# Patient Record
Sex: Female | Born: 1970
Health system: Southern US, Community
[De-identification: ages and names within clinical notes are randomized; demographics above are authoritative.]

## PROBLEM LIST (undated history)

## (undated) DIAGNOSIS — R87619 Unspecified abnormal cytological findings in specimens from cervix uteri: Secondary | ICD-10-CM

## (undated) DIAGNOSIS — A64 Unspecified sexually transmitted disease: Secondary | ICD-10-CM

## (undated) DIAGNOSIS — Z30430 Encounter for insertion of intrauterine contraceptive device: Secondary | ICD-10-CM

## (undated) DIAGNOSIS — M5090 Cervical disc disorder, unspecified, unspecified cervical region: Secondary | ICD-10-CM

## (undated) DIAGNOSIS — F32A Depression, unspecified: Secondary | ICD-10-CM

## (undated) DIAGNOSIS — F419 Anxiety disorder, unspecified: Secondary | ICD-10-CM

## (undated) DIAGNOSIS — F329 Major depressive disorder, single episode, unspecified: Secondary | ICD-10-CM

## (undated) DIAGNOSIS — T782XXA Anaphylactic shock, unspecified, initial encounter: Secondary | ICD-10-CM

## (undated) DIAGNOSIS — G43909 Migraine, unspecified, not intractable, without status migrainosus: Secondary | ICD-10-CM

## (undated) DIAGNOSIS — S61216A Laceration without foreign body of right little finger without damage to nail, initial encounter: Secondary | ICD-10-CM

## (undated) HISTORY — DX: Cervical disc disorder, unspecified, unspecified cervical region: M50.90

## (undated) HISTORY — DX: Anxiety disorder, unspecified: F41.9

## (undated) HISTORY — DX: Depression, unspecified: F32.A

## (undated) HISTORY — DX: Migraine, unspecified, not intractable, without status migrainosus: G43.909

## (undated) HISTORY — DX: Laceration without foreign body of right little finger without damage to nail, initial encounter: S61.216A

## (undated) HISTORY — DX: Unspecified abnormal cytological findings in specimens from cervix uteri: R87.619

## (undated) HISTORY — DX: Major depressive disorder, single episode, unspecified: F32.9

## (undated) HISTORY — DX: Encounter for insertion of intrauterine contraceptive device: Z30.430

## (undated) HISTORY — DX: Unspecified sexually transmitted disease: A64

## (undated) HISTORY — DX: Anaphylactic shock, unspecified, initial encounter: T78.2XXA

---

## 2008-02-09 ENCOUNTER — Other Ambulatory Visit: Admission: RE | Admit: 2008-02-09 | Discharge: 2008-02-09 | Payer: Self-pay | Admitting: Obstetrics & Gynecology

## 2008-04-12 DIAGNOSIS — R87619 Unspecified abnormal cytological findings in specimens from cervix uteri: Secondary | ICD-10-CM

## 2008-04-12 HISTORY — DX: Unspecified abnormal cytological findings in specimens from cervix uteri: R87.619

## 2009-02-01 ENCOUNTER — Encounter: Admission: RE | Admit: 2009-02-01 | Discharge: 2009-02-01 | Payer: Self-pay | Admitting: Family Medicine

## 2009-02-10 HISTORY — PX: COLPOSCOPY: SHX161

## 2009-03-04 ENCOUNTER — Encounter: Admission: RE | Admit: 2009-03-04 | Discharge: 2009-03-04 | Payer: Self-pay | Admitting: Obstetrics & Gynecology

## 2010-06-23 ENCOUNTER — Other Ambulatory Visit: Payer: Self-pay | Admitting: Obstetrics & Gynecology

## 2010-06-23 DIAGNOSIS — Z1231 Encounter for screening mammogram for malignant neoplasm of breast: Secondary | ICD-10-CM

## 2010-07-16 ENCOUNTER — Ambulatory Visit
Admission: RE | Admit: 2010-07-16 | Discharge: 2010-07-16 | Disposition: A | Discharge status: Home / Self Care | Payer: BC Managed Care – PPO | Source: Ambulatory Visit | Attending: Obstetrics & Gynecology | Admitting: Obstetrics & Gynecology

## 2010-07-16 DIAGNOSIS — Z1231 Encounter for screening mammogram for malignant neoplasm of breast: Secondary | ICD-10-CM

## 2011-03-13 DIAGNOSIS — Z30430 Encounter for insertion of intrauterine contraceptive device: Secondary | ICD-10-CM

## 2011-03-13 HISTORY — DX: Encounter for insertion of intrauterine contraceptive device: Z30.430

## 2011-05-13 ENCOUNTER — Encounter (INDEPENDENT_AMBULATORY_CARE_PROVIDER_SITE_OTHER): Payer: BC Managed Care – PPO | Admitting: *Deleted

## 2011-05-13 DIAGNOSIS — M79609 Pain in unspecified limb: Secondary | ICD-10-CM

## 2011-05-13 DIAGNOSIS — R609 Edema, unspecified: Secondary | ICD-10-CM

## 2011-05-17 NOTE — Procedures (Unsigned)
DUPLEX DEEP VENOUS EXAM - LOWER EXTREMITY  INDICATION:  Right lower extremity pain.  HISTORY:  Edema:  No. Trauma/Surgery:  No. Pain:  Yes. PE:  No. Previous DVT:  No. Anticoagulants:  No. Other:  DUPLEX EXAM:               CFV   SFV   PopV  PTV    GSV               R  L  R  L  R  L  R   L  R  L Thrombosis    o     o     o     o      o Spontaneous   +     +     +     +      + Phasic        +     +     +     +      + Augmentation  +     +     +     +      + Compressible  +     +     +     +      + Competent     o     +     +     +      +  Legend:  + - yes  o - no  p - partial  D - decreased  IMPRESSION:  All deep veins of the right lower extremity are patent and compressible.  No evidence of acute deep venous thrombosis visualized.   _____________________________ V. Charlena Cross, MD  SS/MEDQ  D:  05/13/2011  T:  05/13/2011  Job:  [id number]

## 2011-07-20 ENCOUNTER — Other Ambulatory Visit: Payer: Self-pay | Admitting: Obstetrics & Gynecology

## 2011-07-20 DIAGNOSIS — Z1231 Encounter for screening mammogram for malignant neoplasm of breast: Secondary | ICD-10-CM

## 2011-07-28 ENCOUNTER — Ambulatory Visit
Admission: RE | Admit: 2011-07-28 | Discharge: 2011-07-28 | Disposition: A | Discharge status: Home / Self Care | Payer: BC Managed Care – PPO | Source: Ambulatory Visit | Attending: Obstetrics & Gynecology | Admitting: Obstetrics & Gynecology

## 2011-07-28 DIAGNOSIS — Z1231 Encounter for screening mammogram for malignant neoplasm of breast: Secondary | ICD-10-CM

## 2011-07-30 ENCOUNTER — Ambulatory Visit (INDEPENDENT_AMBULATORY_CARE_PROVIDER_SITE_OTHER): Payer: BC Managed Care – PPO | Admitting: General Surgery

## 2011-07-30 ENCOUNTER — Encounter (INDEPENDENT_AMBULATORY_CARE_PROVIDER_SITE_OTHER): Payer: Self-pay | Admitting: General Surgery

## 2011-07-30 VITALS — BP 102/68 | HR 62 | Temp 98.3°F | Ht 67.5 in | Wt 122.0 lb

## 2011-07-30 DIAGNOSIS — K645 Perianal venous thrombosis: Secondary | ICD-10-CM | POA: Insufficient documentation

## 2011-07-30 NOTE — Progress Notes (Signed)
Patient ID: Sandra Juarez, female   DOB: 05-11-1970, 41 y.o.   MRN: [id number]  Chief Complaint  Patient presents with  . Follow-up    eval throm hems    HPI Sandra Juarez is a 41 y.o. female.   HPI  She is referred by Dr. Jacky Kindle for evaluation of an external thrombosed hemorrhoid.  She began having pain and swelling 3 days ago that progressively worsened.  She began having some bleeding yesterday. She went to Dr. Lanell Matar office and then was referred here.  He gave her a prescription for a hemorrhoidal cream.  History reviewed. No pertinent past medical history.  Past Surgical History  Procedure Date  . Cesarean section     History reviewed. No pertinent family history.  Social History History  Substance Use Topics  . Smoking status: Never Smoker   . Smokeless tobacco: Not on file  . Alcohol Use: Yes     social    Allergies  Allergen Reactions  . Lidocaine     Current Outpatient Prescriptions  Medication Sig Dispense Refill  . citalopram (CELEXA) 10 MG tablet Take 10 mg by mouth daily.        Review of Systems Review of Systems  Constitutional: Negative for fever.  Gastrointestinal: Positive for anal bleeding and rectal pain.    Blood pressure 102/68, pulse 62, temperature 98.3 F (36.8 C), temperature source Temporal, height 5' 7.5" (1.715 m), weight 122 lb (55.339 kg), SpO2 98.00%.  Physical Exam Physical Exam  Constitutional: She appears well-developed and well-nourished. No distress.  Genitourinary:       Tender left lateral swelling with purple discoloration present. I massaged this area and she began having spontaneous drainage and evacuation of the clot. I used a hemostat to remove the rest of the clot.    Data Reviewed none  Assessment    Thrombosed external hemorrhoid-this has been drained in the office.    Plan    Frequent warm water soaks this weekend. Use of steroid cream is prescribed.  Go to the emergency department for heavy  bleeding. Return visit in 4 weeks.       Ahja Martello J 07/30/2011, 5:22 PM

## 2011-07-30 NOTE — Patient Instructions (Signed)
Go home and soak in warm water as much as possible this weekend.  No heavy lifting or straining for one week.  If you have heavy bleeding, go to the emergency room.  Use hemorrhoid cream on the area 4 times a day.  It could take two weeks or more for the swelling to go down.

## 2011-08-31 ENCOUNTER — Encounter (INDEPENDENT_AMBULATORY_CARE_PROVIDER_SITE_OTHER): Payer: BC Managed Care – PPO | Admitting: General Surgery

## 2012-07-25 ENCOUNTER — Other Ambulatory Visit: Payer: Self-pay

## 2012-07-25 DIAGNOSIS — Z1231 Encounter for screening mammogram for malignant neoplasm of breast: Secondary | ICD-10-CM

## 2012-08-30 ENCOUNTER — Ambulatory Visit
Admission: RE | Admit: 2012-08-30 | Discharge: 2012-08-30 | Disposition: A | Discharge status: Home / Self Care | Payer: BC Managed Care – PPO | Source: Ambulatory Visit

## 2012-08-30 DIAGNOSIS — Z1231 Encounter for screening mammogram for malignant neoplasm of breast: Secondary | ICD-10-CM

## 2013-05-15 ENCOUNTER — Encounter: Payer: Self-pay | Admitting: Obstetrics and Gynecology

## 2013-05-21 ENCOUNTER — Ambulatory Visit: Payer: Self-pay | Admitting: Obstetrics and Gynecology

## 2013-06-20 ENCOUNTER — Encounter: Payer: Self-pay | Admitting: Obstetrics and Gynecology

## 2013-06-20 ENCOUNTER — Ambulatory Visit (INDEPENDENT_AMBULATORY_CARE_PROVIDER_SITE_OTHER): Payer: 59 | Admitting: Obstetrics and Gynecology

## 2013-06-20 VITALS — BP 100/60 | HR 80 | Resp 18 | Ht 67.0 in | Wt 123.0 lb

## 2013-06-20 DIAGNOSIS — Z0000 Encounter for general adult medical examination without abnormal findings: Secondary | ICD-10-CM

## 2013-06-20 DIAGNOSIS — Z01419 Encounter for gynecological examination (general) (routine) without abnormal findings: Secondary | ICD-10-CM

## 2013-06-20 DIAGNOSIS — Z23 Encounter for immunization: Secondary | ICD-10-CM

## 2013-06-20 LAB — POCT URINALYSIS DIPSTICK
BILIRUBIN UA: NEGATIVE
Blood, UA: NEGATIVE
Glucose, UA: NEGATIVE
Ketones, UA: NEGATIVE
Leukocytes, UA: NEGATIVE
NITRITE UA: NEGATIVE
Protein, UA: NEGATIVE
UROBILINOGEN UA: NEGATIVE
pH, UA: 5

## 2013-06-20 NOTE — Patient Instructions (Signed)
EXERCISE AND DIET:  We recommended that you start or continue a regular exercise program for good health. Regular exercise means any activity that makes your heart beat faster and makes you sweat.  We recommend exercising at least 30 minutes per day at least 3 days a week, preferably 4 or 5.  We also recommend a diet low in fat and sugar.  Inactivity, poor dietary choices and obesity can cause diabetes, heart attack, stroke, and kidney damage, among others.    ALCOHOL AND SMOKING:  Women should limit their alcohol intake to no more than 7 drinks/beers/glasses of wine (combined, not each!) per week. Moderation of alcohol intake to this level decreases your risk of breast cancer and liver damage. And of course, no recreational drugs are part of a healthy lifestyle.  And absolutely no smoking or even second hand smoke. Most people know smoking can cause heart and lung diseases, but did you know it also contributes to weakening of your bones? Aging of your skin?  Yellowing of your teeth and nails?  CALCIUM AND VITAMIN D:  Adequate intake of calcium and Vitamin D are recommended.  The recommendations for exact amounts of these supplements seem to change often, but generally speaking 600 mg of calcium (either carbonate or citrate) and 800 units of Vitamin D per day seems prudent. Certain women may benefit from higher intake of Vitamin D.  If you are among these women, your doctor will have told you during your visit.    PAP SMEARS:  Pap smears, to check for cervical cancer or precancers,  have traditionally been done yearly, although recent scientific advances have shown that most women can have pap smears less often.  However, every woman still should have a physical exam from her gynecologist every year. It will include a breast check, inspection of the vulva and vagina to check for abnormal growths or skin changes, a visual exam of the cervix, and then an exam to evaluate the size and shape of the uterus and  ovaries.  And after 43 years of age, a rectal exam is indicated to check for rectal cancers. We will also provide age appropriate advice regarding health maintenance, like when you should have certain vaccines, screening for sexually transmitted diseases, bone density testing, colonoscopy, mammograms, etc.   MAMMOGRAMS:  All women over 40 years old should have a yearly mammogram. Many facilities now offer a "3D" mammogram, which may cost around $50 extra out of pocket. If possible,  we recommend you accept the option to have the 3D mammogram performed.  It both reduces the number of women who will be called back for extra views which then turn out to be normal, and it is better than the routine mammogram at detecting truly abnormal areas.    COLONOSCOPY:  Colonoscopy to screen for colon cancer is recommended for all women at age 50.  We know, you hate the idea of the prep.  We agree, BUT, having colon cancer and not knowing it is worse!!  Colon cancer so often starts as a polyp that can be seen and removed at colonscopy, which can quite literally save your life!  And if your first colonoscopy is normal and you have no family history of colon cancer, most women don't have to have it again for 10 years.  Once every ten years, you can do something that may end up saving your life, right?  We will be happy to help you get it scheduled when you are ready.    Be sure to check your insurance coverage so you understand how much it will cost.  It may be covered as a preventative service at no cost, but you should check your particular policy.     Tetanus, Diphtheria, Pertussis (Tdap) Vaccine What You Need to Know WHY GET VACCINATED? Tetanus, diphtheria and pertussis can be very serious diseases, even for adolescents and adults. Tdap vaccine can protect [place] from these diseases. TETANUS (Lockjaw) causes painful muscle tightening and stiffness, usually all over the body.  It can lead to tightening of muscles in the  head and neck so you can't open your mouth, swallow, or sometimes even breathe. Tetanus kills about 1 out of 5 people who are infected. DIPHTHERIA can cause a thick coating to form in the back of the throat.  It can lead to breathing problems, paralysis, heart failure, and death. PERTUSSIS (Whooping Cough) causes severe coughing spells, which can cause difficulty breathing, vomiting and disturbed sleep.  It can also lead to weight loss, incontinence, and rib fractures. Up to 2 in 100 adolescents and 5 in 100 adults with pertussis are hospitalized or have complications, which could include pneumonia and death. These diseases are caused by bacteria. Diphtheria and pertussis are spread from person to person through coughing or sneezing. Tetanus enters the body through cuts, scratches, or wounds. Before vaccines, the [place] saw as many as 200,000 cases a year of diphtheria and pertussis, and hundreds of cases of tetanus. Since vaccination began, tetanus and diphtheria have dropped by about 99% and pertussis by about 80%. TDAP VACCINE Tdap vaccine can protect adolescents and adults from tetanus, diphtheria, and pertussis. One dose of Tdap is routinely given at age 46 or 70. People who did not get Tdap at that age should get it as soon as possible. Tdap is especially important for health care professionals and anyone having close contact with a baby younger than 12 months. Pregnant women should get a dose of Tdap during every pregnancy, to protect the newborn from pertussis. Infants are most at risk for severe, life-threatening complications from pertussis. A similar vaccine, called Td, protects from tetanus and diphtheria, but not pertussis. A Td booster should be given every 10 years. Tdap may be given as one of these boosters if you have not already gotten a dose. Tdap may also be given after a severe cut or burn to prevent tetanus infection. Your doctor can give you more information. Tdap may  safely be given at the same time as other vaccines. SOME PEOPLE SHOULD NOT GET THIS VACCINE  If you ever had a life-threatening allergic reaction after a dose of any tetanus, diphtheria, or pertussis containing vaccine, OR if you have a severe allergy to any part of this vaccine, you should not get Tdap. Tell your doctor if you have any severe allergies.  If you had a coma, or long or multiple seizures within 7 days after a childhood dose of DTP or DTaP, you should not get Tdap, unless a cause other than the vaccine was found. You can still get Td.  Talk to your doctor if you:  have epilepsy or another nervous system problem,  had severe pain or swelling after any vaccine containing diphtheria, tetanus or pertussis,  ever had Guillain-Barr Syndrome (GBS),  aren't feeling well on the day the shot is scheduled. RISKS OF A VACCINE REACTION With any medicine, including vaccines, there is a chance of side effects. These are usually mild and go away on their own, but  serious reactions are also possible. Brief fainting spells can follow a vaccination, leading to injuries from falling. Sitting or lying down for about 15 minutes can help prevent these. Tell your doctor if you feel dizzy or light-headed, or have vision changes or ringing in the ears. Mild problems following Tdap (Did not interfere with activities)  Pain where the shot was given (about 3 in 4 adolescents or 2 in 3 adults)  Redness or swelling where the shot was given (about 1 person in 5)  Mild fever of at least 100.4F (up to about 1 in 25 adolescents or 1 in 100 adults)  Headache (about 3 or 4 people in 10)  Tiredness (about 1 person in 3 or 4)  Nausea, vomiting, diarrhea, stomach ache (up to 1 in 4 adolescents or 1 in 10 adults)  Chills, body aches, sore joints, rash, swollen glands (uncommon) Moderate problems following Tdap (Interfered with activities, but did not require medical attention)  Pain where the shot was  given (about 1 in 5 adolescents or 1 in 100 adults)  Redness or swelling where the shot was given (up to about 1 in 16 adolescents or 1 in 25 adults)  Fever over 102F (about 1 in 100 adolescents or 1 in 250 adults)  Headache (about 3 in 20 adolescents or 1 in 10 adults)  Nausea, vomiting, diarrhea, stomach ache (up to 1 or 3 people in 100)  Swelling of the entire arm where the shot was given (up to about 3 in 100). Severe problems following Tdap (Unable to perform usual activities, required medical attention)  Swelling, severe pain, bleeding and redness in the arm where the shot was given (rare). A severe allergic reaction could occur after any vaccine (estimated less than 1 in a million doses). WHAT IF THERE IS A SERIOUS REACTION? What should I look for?  Look for anything that concerns you, such as signs of a severe allergic reaction, very high fever, or behavior changes. Signs of a severe allergic reaction can include hives, swelling of the face and throat, difficulty breathing, a fast heartbeat, dizziness, and weakness. These would start a few minutes to a few hours after the vaccination. What should I do?  If you think it is a severe allergic reaction or other emergency that can't wait, call 9-1-1 or get the person to the nearest hospital. Otherwise, call your doctor.  Afterward, the reaction should be reported to the "Vaccine Adverse Event Reporting System" (VAERS). Your doctor might file this report, or you can do it yourself through the VAERS web site at www.vaers.hhs.gov, or by calling 1-800-822-7967. VAERS is only for reporting reactions. They do not give medical advice.  THE NATIONAL VACCINE INJURY COMPENSATION PROGRAM The National Vaccine Injury Compensation Program (VICP) is a federal program that was created to compensate people who may have been injured by certain vaccines. Persons who believe they may have been injured by a vaccine can learn about the program and about  filing a claim by calling 1-800-338-2382 or visiting the VICP website at www.hrsa.gov/vaccinecompensation. HOW CAN I LEARN MORE?  Ask your doctor.  Call your local or state health department.  Contact the Centers for Disease Control and Prevention (CDC):  Call 1-800-232-4636 or visit CDC's website at www.cdc.gov/vaccines. CDC Tdap Vaccine VIS (08/19/11) Document Released: 09/28/2011 Document Revised: 07/24/2012 Document Reviewed: 07/19/2012 [employer] Patient Information 2014 [employer].  

## 2013-06-20 NOTE — Progress Notes (Signed)
Patient ID: Sandra Juarez, female   DOB: [birth year], 43 y.o.   MRN: [id number] GYNECOLOGY VISIT  PCP:   Burnard Bunting, MD  Referring provider:   HPI: 43 y.o.   Married  Caucasian  female   G2P2 with No LMP recorded. Patient is not currently having periods (Reason: IUD).   here for  AEX.  On double dose of Topamax and migraines are better controlled.   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   Has had hemorrhoids drained in past.  Hgb:    PCP Urine:  negative  GYNECOLOGIC HISTORY: No LMP recorded. Patient is not currently having periods (Reason: IUD). Sexually active:  yes Partner preference: female Contraception:   Mirena IUD--inserted 03/2011 Menopausal hormone therapy: n/a DES exposure:  no  Blood transfusions:   no Sexually transmitted diseases:   HPV GYN procedures and prior surgeries:  C-section x2, colposcopy with biopsies. Last mammogram:  08-30-12 wnl:[facility]               Last pap and high risk HPV testing:   05-18-12 wnl:neg HR HPV History of abnormal pap smear: 02/2009 colposcopy revealed CIN I    OB History   Grav Para Term Preterm Abortions TAB SAB Ect Mult Living   2 2        2        LIFESTYLE: Exercise:   no            Tobacco:   no Alcohol:     10 glasses of wine per week Drug use:  no  OTHER HEALTH MAINTENANCE: Tetanus/TDap:   unsure Gardisil:               n/a Influenza:            01/2013 Zostavax:             n/a  Bone density:       n/a Colonoscopy:       n/a  Cholesterol check:   wnl with PCP  Family History  Problem Relation Age of Onset  . Hypertension Brother   . Diabetes Brother     AODM  . Cancer Paternal Grandfather 10    dec    Patient Active Problem List   Diagnosis Date Noted  . Thrombosed external hemorrhoid-left lateral 07/30/2011   Past Medical History  Diagnosis Date  . Migraines   . Abnormal Pap smear of cervix 2010  . Encounter for insertion of mirena IUD 03/2011  . Depression   . Anxiety   .  Anaphylactic reaction     x 2   (hospitalized)  . STD (sexually transmitted disease)     HPV    Past Surgical History  Procedure Laterality Date  . Colposcopy  02/2009    LGSIL on Bx  . Cesarean section      x 2    ALLERGIES: Lidocaine  Current Outpatient Prescriptions  Medication Sig Dispense Refill  . citalopram (CELEXA) 10 MG tablet Take 10 mg by mouth daily.      Marland Kitchen levonorgestrel (MIRENA) 20 MCG/24HR IUD 1 each by Intrauterine route once.      . rizatriptan (MAXALT-MLT) 10 MG disintegrating tablet Take 10 mg by mouth as needed.      . topiramate (TOPAMAX) 25 MG tablet Take 50 mg by mouth daily.      Marland Kitchen ZOLPIDEM TARTRATE PO Take 12.5 mg by mouth.       No current facility-administered medications for this visit.  ROS:  Pertinent items are noted in HPI.  SOCIAL HISTORY:  Married.  Two sons.   PHYSICAL EXAMINATION:    BP 100/60  Pulse 80  Resp 18  Ht 5\' 7"  (1.702 m)  Wt 123 lb (55.792 kg)  BMI 19.26 kg/m2   Wt Readings from Last 3 Encounters:  06/20/13 123 lb (55.792 kg)  07/30/11 122 lb (55.339 kg)     Ht Readings from Last 3 Encounters:  06/20/13 5\' 7"  (1.702 m)  07/30/11 5' 7.5" (1.715 m)    General appearance: alert, cooperative and appears stated age Head: Normocephalic, without obvious abnormality, atraumatic Neck: no adenopathy, supple, symmetrical, trachea midline and thyroid not enlarged, symmetric, no tenderness/mass/nodules Lungs: clear to auscultation bilaterally Breasts: Inspection negative, No nipple retraction or dimpling, No nipple discharge or bleeding, No axillary or supraclavicular adenopathy, Normal to palpation without dominant masses Heart: regular rate and rhythm Abdomen: Pfannenstiel incision, soft, non-tender; no masses,  no organomegaly Extremities: extremities normal, atraumatic, no cyanosis or edema Skin: Skin color, texture, turgor normal. No rashes or lesions Lymph nodes: Cervical, supraclavicular, and axillary nodes  normal. No abnormal inguinal nodes palpated Neurologic: Grossly normal  Pelvic: External genitalia:  no lesions              Urethra:  normal appearing urethra with no masses, tenderness or lesions              Bartholins and Skenes: normal                 Vagina: normal appearing vagina with normal color and discharge, no lesions              Cervix: normal appearance.  IUD strings noted.               Pap and high risk HPV testing done: no.            Bimanual Exam:  Uterus:  uterus is normal size, shape, consistency and nontender                                      Adnexa: normal adnexa in size, nontender and no masses                                      Rectovaginal: Confirms                                      Anus:  normal sphincter tone, no lesions, anterior hemorrhoid palpable at 12 o'clock.   ASSESSMENT  Normal gynecologic exam. History of LGSIL. Mirena IUD patient.  Migraines.  PLAN  Mammogram recommended yearly.  Pap smear and high risk HPV testing not indicated. Counseled on self breast exam, Calcium and vitamin D intake, exercise. TDap. Return annually or prn   An After Visit Summary was printed and given to the patient.

## 2013-06-22 ENCOUNTER — Ambulatory Visit: Payer: Self-pay | Admitting: Obstetrics and Gynecology

## 2013-07-24 ENCOUNTER — Other Ambulatory Visit: Payer: Self-pay

## 2013-07-24 DIAGNOSIS — Z1231 Encounter for screening mammogram for malignant neoplasm of breast: Secondary | ICD-10-CM

## 2013-09-06 ENCOUNTER — Ambulatory Visit
Admission: RE | Admit: 2013-09-06 | Discharge: 2013-09-06 | Disposition: A | Discharge status: Home / Self Care | Payer: 59 | Source: Ambulatory Visit

## 2013-09-06 ENCOUNTER — Encounter (INDEPENDENT_AMBULATORY_CARE_PROVIDER_SITE_OTHER): Payer: Self-pay

## 2013-09-06 DIAGNOSIS — Z1231 Encounter for screening mammogram for malignant neoplasm of breast: Secondary | ICD-10-CM

## 2014-02-11 ENCOUNTER — Encounter: Payer: Self-pay | Admitting: Obstetrics and Gynecology

## 2014-06-05 ENCOUNTER — Ambulatory Visit (INDEPENDENT_AMBULATORY_CARE_PROVIDER_SITE_OTHER): Payer: 59 | Admitting: Neurology

## 2014-06-05 ENCOUNTER — Ambulatory Visit (INDEPENDENT_AMBULATORY_CARE_PROVIDER_SITE_OTHER): Payer: Self-pay | Admitting: Neurology

## 2014-06-05 DIAGNOSIS — R202 Paresthesia of skin: Secondary | ICD-10-CM

## 2014-06-05 NOTE — Procedures (Signed)
  [facility]    Provider:  Dr Jaynee Eagles Referring Provider: Geoffery Lyons, MD Primary Care Physician:  Geoffery Lyons, MD  History:  Sandra Juarez is a 44 y.o. female here as a referral from Dr. Reynaldo Minium for left upper extremity sensory changes. Symptoms started a few months ago. She endorses numbness and tingling in the left hand in all the fingers with radiation to the dorsal aspect of the forearm. Episodic, lasts a few minutes. Denies neck pain. Had one episode of right hand digit 1-3 numbness. Also has occasional numbness and tingling in all the limbs.   Summary  Nerve conduction studies were performed on the left upper extremity  The left APB Median motor nerve showed normal conductions with normal F Wave latency The left ADM Ulnar motor nerve showed normal conductions with normal F Wave latency The left second-digit Median sensory nerve conduction was within normal limits The left fifth-digit Ulnar sensory nerve conduction was within normal limits The left Radial sensory nerve conduction was within normal limits The left Median/Ulnar (palm) comparison nerve showed normal peak latency difference  EMG Needle study was performed on selected left upper extremity muscles:   The Deltoid, Triceps, Pronator Teres, Opponens Pollicis, Flexor Digitorum Profundus,  Extensor Indicis, First Dorsal interosseous muscles were within normal limits.  Conclusion: This is a normal study. No electrophysiologic evidence for ulnar or median neuropathy, peripheral polyneuropathy or radiculopathy. Clinical correlation recommended.   Sarina Ill, MD  [facility] [address] [place], [place] [address]  Phone [phone] Fax [phone]

## 2014-06-05 NOTE — Progress Notes (Signed)
  [facility]    Provider:  Dr Jaynee Eagles Referring Provider: Geoffery Lyons, MD Primary Care Physician:  Geoffery Lyons, MD  History:  Sandra Juarez is a 44 y.o. female here as a referral from Dr. Reynaldo Minium for left upper extremity sensory changes. Symptoms started a few months ago. She endorses numbness and tingling in the left hand in all the fingers with radiation to the dorsal aspect of the forearm. Episodic, lasts a few minutes. Denies neck pain. Had one episode of right hand digit 1-3 numbness. Also has occasional numbness and tingling in all the limbs.   Summary  Nerve conduction studies were performed on the left upper extremity  The left APB Median motor nerve showed normal conductions with normal F Wave latency The left ADM Ulnar motor nerve showed normal conductions with normal F Wave latency The left second-digit Median sensory nerve conduction was within normal limits The left fifth-digit Ulnar sensory nerve conduction was within normal limits The left Radial sensory nerve conduction was within normal limits The left Median/Ulnar (palm) comparison nerve showed normal peak latency difference  EMG Needle study was performed on selected left upper extremity muscles:   The Deltoid, Triceps, Pronator Teres, Opponens Pollicis, Flexor Digitorum Profundus,  Extensor Indicis, First Dorsal interosseous muscles were within normal limits.  Conclusion: This is a normal study. No electrophysiologic evidence for ulnar or median neuropathy, peripheral polyneuropathy or radiculopathy. Clinical correlation recommended.   Sarina Ill, MD  [facility] [address] [place], [place] [address]  Phone [phone] Fax [phone]

## 2014-06-05 NOTE — Progress Notes (Signed)
See procedure note.

## 2014-06-12 ENCOUNTER — Encounter: Payer: Self-pay | Admitting: Neurology

## 2014-06-12 ENCOUNTER — Ambulatory Visit (INDEPENDENT_AMBULATORY_CARE_PROVIDER_SITE_OTHER): Payer: 59 | Admitting: Neurology

## 2014-06-12 VITALS — BP 99/63 | HR 63 | Ht 67.0 in | Wt 125.0 lb

## 2014-06-12 DIAGNOSIS — G43809 Other migraine, not intractable, without status migrainosus: Secondary | ICD-10-CM | POA: Diagnosis not present

## 2014-06-12 DIAGNOSIS — H538 Other visual disturbances: Secondary | ICD-10-CM

## 2014-06-12 DIAGNOSIS — G43009 Migraine without aura, not intractable, without status migrainosus: Secondary | ICD-10-CM | POA: Insufficient documentation

## 2014-06-12 DIAGNOSIS — R531 Weakness: Secondary | ICD-10-CM | POA: Diagnosis not present

## 2014-06-12 DIAGNOSIS — R202 Paresthesia of skin: Secondary | ICD-10-CM

## 2014-06-12 NOTE — Patient Instructions (Signed)
Overall you are doing fairly well but I do want to suggest a few things today:   Remember to drink plenty of fluid, eat healthy meals and do not skip any meals. Try to eat protein with a every meal and eat a healthy snack such as fruit or nuts in between meals. Try to keep a regular sleep-wake schedule and try to exercise daily, particularly in the form of walking, 20-30 minutes a day, if you can.   As far as your medications are concerned, I would like to suggest: Decrease Topamx  As far as diagnostic testing: MRI of the brain and cervical spine if decreasing topamax doesn't work  I would like to see you back as needed, sooner if we need to. Please call us with any interim questions, concerns, problems, updates or refill requests.   Please also call us for any test results so we can go over those with you on the phone.  My clinical assistant and will answer any of your questions and relay your messages to me and also relay most of my messages to you.   Our phone number is [phone]. We also have an after hours call service for urgent matters and there is a physician on-call for urgent questions. For any emergencies you know to call 911 or go to the nearest emergency room

## 2014-06-12 NOTE — Progress Notes (Signed)
[facility]    Provider:  Dr Jaynee Eagles Referring Provider: Geoffery Lyons, MD Primary Care Physician:  Geoffery Lyons, MD  CC:  Paresthesias  HPI:  Sandra Juarez is a 44 y.o. female here as a referral from Dr. Reynaldo Minium for paresthesias  Sandra Juarez is a 44 y.o. female here as a referral from Dr. Reynaldo Minium for left upper extremity sensory changes and weakness. Symptoms started a few months ago. She endorses numbness and tingling in the left hand in all the fingers with radiation to the dorsal aspect of the forearm. Episodic, lasts a few seconds to a few minutes, daily 15-20 times a day, no inciting factors, not positional. The last few nights the other arm feels tired and heavy and numb, weak. She has been waking up a weird feeling, feels weak. No neck pain or radicular symptoms. Vision has changed drastically, she bought a great big pack of readers. Denies neck pain. Had one episode of right hand digit 1-3 numbness. Also has occasional numbness and tingling in all the limbs. No inciting factors. Worsening and disturbing. She is on Topamax for migraines. Topamax was increased a year ago. She takes 2 tablets at night.   EMG/NCS of the upper extremities normal, no evidence of median or ulnar neuropathy, polyneuropathy or radiculopathy.   Review of Systems: Patient complains of symptoms per HPI as well as the following symptoms: numbness, headache. Pertinent negatives per HPI. All others negative.   History   Social History  . Marital Status: Married    Spouse Name: Lillianna Sabel  . Number of Children: 2  . Years of Education: Masters   Occupational History  . Not on file.   Social History Main Topics  . Smoking status: Never Smoker   . Smokeless tobacco: Not on file  . Alcohol Use: 5.0 oz/week    10 drink(s) per week     Comment: 10 glasses of wine per week  . Drug Use: No  . Sexual Activity:    Partners: Male    Birth Control/ Protection: IUD   Comment: Mirena--inserted 03/2011   Other Topics Concern  . Not on file   Social History Narrative   Lives at home with husband and 2 children   Caffeine use: occasionally (2-3/week, includes coffee, diet coke)     Family History  Problem Relation Age of Onset  . Hypertension Brother   . Diabetes Brother     AODM  . Cancer Paternal Grandfather 65    dec  . Multiple sclerosis Mother     Past Medical History  Diagnosis Date  . Migraines   . Abnormal Pap smear of cervix 2010  . Encounter for insertion of mirena IUD 03/2011  . Depression   . Anxiety   . Anaphylactic reaction     x 2   (hospitalized)  . STD (sexually transmitted disease)     HPV    Past Surgical History  Procedure Laterality Date  . Colposcopy  02/2009    LGSIL on Bx  . Cesarean section  2004 ,2006    x 2    Current Outpatient Prescriptions  Medication Sig Dispense Refill  . citalopram (CELEXA) 20 MG tablet Take 20 mg by mouth daily.  3  . levonorgestrel (MIRENA) 20 MCG/24HR IUD 1 each by Intrauterine route once.    . rizatriptan (MAXALT-MLT) 10 MG disintegrating tablet Take 10 mg by mouth as needed.    . terbinafine (LAMISIL) 250 MG tablet Take 1 tablet  by mouth daily.    Marland Kitchen zolpidem (AMBIEN CR) 12.5 MG CR tablet Take 12.5 mg by mouth at bedtime.  1   No current facility-administered medications for this visit.    Allergies as of 06/12/2014 - Review Complete 06/12/2014  Allergen Reaction Noted  . Lidocaine  07/30/2011    Vitals: BP 99/63 mmHg  Pulse 63  Ht 5\' 7"  (1.702 m)  Wt 125 lb (56.7 kg)  BMI 19.57 kg/m2 Last Weight:  Wt Readings from Last 1 Encounters:  06/12/14 125 lb (56.7 kg)   Last Height:   Ht Readings from Last 1 Encounters:  06/12/14 5\' 7"  (1.702 m)   Physical exam: Exam: Gen: NAD, conversant, well nourised, well groomed                     CV: RRR, no MRG. No Carotid Bruits. No peripheral edema, warm, nontender Eyes: Conjunctivae clear without exudates or  hemorrhage  Neuro: Detailed Neurologic Exam  Speech:    Speech is normal; fluent and spontaneous with normal comprehension.  Cognition:    The patient is oriented to person, place, and time;     recent and remote memory intact;     language fluent;     normal attention, concentration,     fund of knowledge Cranial Nerves:    The pupils are equal, round, and reactive to light. The fundi are normal and spontaneous venous pulsations are present. Visual fields are full to finger confrontation. Extraocular movements are intact. Trigeminal sensation is intact and the muscles of mastication are normal. The face is symmetric. The palate elevates in the midline. Hearing intact. Voice is normal. Shoulder shrug is normal. The tongue has normal motion without fasciculations.   Coordination:    Normal finger to nose and heel to shin. Normal rapid alternating movements.   Gait:    Heel-toe and tandem gait are normal.   Motor Observation:    No asymmetry, no atrophy, and no involuntary movements noted. Tone:    Normal muscle tone.    Posture:    Posture is normal. normal erect    Strength:    Strength is V/V in the upper and lower limbs.      Sensation: intact to LT     Reflex Exam:  DTR's:    Deep tendon reflexes in the upper and lower extremities are normal bilaterally.   Toes:    The toes are downgoing bilaterally.   Clonus:    Clonus is absent.       Assessment/Plan:  44 year old female with a PMHx of migraines on Topamax with worsening paresthesias, visual changes, weakness of the right arm. EMG/NCS of the upper arms normal. Decrease topamax slowly, to 1 tab at night for 5 days then can stop to see if these are side effects. If no improvement, recommend MRI of the brain and cervical spine.  Sarina Ill, MD  [facility] [address] [place], [place] [address]  Phone [phone] Fax [phone]  A total of 45 minutes was spent in  with this patient. Over half this time was spent on counseling patient on the diagnosis and different therapeutic options available.

## 2014-06-27 ENCOUNTER — Telehealth: Payer: Self-pay | Admitting: Neurology

## 2014-06-27 NOTE — Telephone Encounter (Signed)
Returning pt call from earlier. Left message for pt to call back. Gave [facility] phone number.

## 2014-06-27 NOTE — Telephone Encounter (Signed)
Pt is calling stating she is still having tingling in her left arm, it has gotten a little better.  She would like a call back from the nurse.

## 2014-06-27 NOTE — Telephone Encounter (Signed)
Talked with pt and she states that she has gone off of the Topamax like Dr. Jaynee Eagles instructed her to do and is still experiencing tingling symptoms in her left arm from her fingers up to her bicep. However, she has noticed she is having hot flashes and has more energy going off of the Topamax. At first, she thought tingling improved but she has the symptoms come on suddenly at times. Pt stated as examples, "today when I went to the bank and was leaning against the wall". Pt unsure if it is positional or if anything triggers tingling. I told pt from Dr. Jaynee Eagles last office visit notes that she may want to move forward with the MRI of the brain and cervical spine since she was still having symptoms but I would have to let Dr. Jaynee Eagles know first and see what she wants to do.  Pt also had a question about her Lenda Kelp IUD that she has for birth control and whether that would cause the symptoms to occur. She said she has an appointment with her gynecologists tomorrow and I advised her to ask them when she goes and I would make Dr. Jaynee Eagles awarePt verbalized understanding. Pt verbalized understanding.

## 2014-06-28 ENCOUNTER — Ambulatory Visit: Payer: Self-pay | Admitting: Obstetrics & Gynecology

## 2014-06-28 ENCOUNTER — Ambulatory Visit (INDEPENDENT_AMBULATORY_CARE_PROVIDER_SITE_OTHER): Payer: 59 | Admitting: Obstetrics & Gynecology

## 2014-06-28 ENCOUNTER — Encounter: Payer: Self-pay | Admitting: Obstetrics & Gynecology

## 2014-06-28 VITALS — BP 100/62 | HR 56 | Resp 16 | Ht 67.5 in | Wt 124.0 lb

## 2014-06-28 DIAGNOSIS — Z0000 Encounter for general adult medical examination without abnormal findings: Secondary | ICD-10-CM

## 2014-06-28 DIAGNOSIS — Z124 Encounter for screening for malignant neoplasm of cervix: Secondary | ICD-10-CM | POA: Diagnosis not present

## 2014-06-28 DIAGNOSIS — Z01419 Encounter for gynecological examination (general) (routine) without abnormal findings: Secondary | ICD-10-CM | POA: Diagnosis not present

## 2014-06-28 LAB — HEPATIC FUNCTION PANEL
ALBUMIN: 4.2 g/dL (ref 3.5–5.2)
ALK PHOS: 32 U/L — AB (ref 39–117)
ALT: 16 U/L (ref 0–35)
AST: 17 U/L (ref 0–37)
BILIRUBIN INDIRECT: 0.4 mg/dL (ref 0.2–1.2)
BILIRUBIN TOTAL: 0.5 mg/dL (ref 0.2–1.2)
Bilirubin, Direct: 0.1 mg/dL (ref 0.0–0.3)
Total Protein: 7 g/dL (ref 6.0–8.3)

## 2014-06-28 NOTE — Addendum Note (Signed)
Addended by: Megan Salon on: 06/28/2014 04:23 PM   Modules accepted: Orders, SmartSet

## 2014-06-28 NOTE — Telephone Encounter (Signed)
Called patient. Left message. I think we should proceed with MRi of the brain and cervical cord. Orders have already been placed at last appointment.  Terrence Dupont - We held off on imaging at last appointment to see if decreasing Topamax helped. So can you ask Larene Beach to move forward with imaging at this time? Thank you

## 2014-06-28 NOTE — Progress Notes (Addendum)
44 y.o. G2P2 MarriedCaucasianF here for annual exam.  Biggest issue most recently has been numbness in most of left arm.  Being followed by neurologist.  Has had peripheral nerve testing and blood work.  Stopped Topamax.  Not having any new headache issues.    No LMP recorded. Patient is not currently having periods (Reason: IUD).          Sexually active: Yes.    The current method of family planning is IUD.    Exercising: No.  not regularly Smoker:  no  Health Maintenance: Pap:  05/18/12 WNL/negative HR HPV History of abnormal Pap:  Yes  MMG:  09/06/13 3D-normal Colonoscopy:  none BMD:   none TDaP:  06/20/13 Screening Labs: PCP, Hb today: PCP, Urine today: PCP   reports that she has never smoked. She has never used smokeless tobacco. She reports that she drinks about 6.0 oz of alcohol per week. She reports that she does not use illicit drugs.  Past Medical History  Diagnosis Date  . Migraines   . Abnormal Pap smear of cervix 2010  . Encounter for insertion of mirena IUD 03/2011  . Depression   . Anxiety   . Anaphylactic reaction     x 2   (hospitalized)  . STD (sexually transmitted disease)     HPV    Past Surgical History  Procedure Laterality Date  . Colposcopy  02/2009    LGSIL on Bx  . Cesarean section  2004 ,2006    x 2    Current Outpatient Prescriptions  Medication Sig Dispense Refill  . citalopram (CELEXA) 20 MG tablet Take 20 mg by mouth daily.  3  . levonorgestrel (MIRENA) 20 MCG/24HR IUD 1 each by Intrauterine route once.    . rizatriptan (MAXALT-MLT) 10 MG disintegrating tablet Take 10 mg by mouth as needed.    . terbinafine (LAMISIL) 250 MG tablet Take 1 tablet by mouth daily.    Marland Kitchen zolpidem (AMBIEN CR) 12.5 MG CR tablet Take 12.5 mg by mouth at bedtime.  1   No current facility-administered medications for this visit.    Family History  Problem Relation Age of Onset  . Hypertension Brother   . Diabetes Brother     AODM  . Cancer Paternal Grandfather  33    dec  . Multiple sclerosis Mother     ROS:  Pertinent items are noted in HPI.  Otherwise, a comprehensive ROS was negative.  Exam:    General appearance: alert, cooperative and appears stated age Head: Normocephalic, without obvious abnormality, atraumatic Neck: no adenopathy, supple, symmetrical, trachea midline and thyroid normal to inspection and palpation Lungs: clear to auscultation bilaterally Breasts: normal appearance, no masses or tenderness Heart: regular rate and rhythm Abdomen: soft, non-tender; bowel sounds normal; no masses,  no organomegaly Extremities: extremities normal, atraumatic, no cyanosis or edema Skin: Skin color, texture, turgor normal. No rashes or lesions Lymph nodes: Cervical, supraclavicular, and axillary nodes normal. No abnormal inguinal nodes palpated Neurologic: Grossly normal   Pelvic: External genitalia:  no lesions              Urethra:  normal appearing urethra with no masses, tenderness or lesions              Bartholins and Skenes: normal                 Vagina: normal appearing vagina with normal color and discharge, no lesions  Cervix: no lesions, IUD string noted              Pap taken: Yes.   Bimanual Exam:  Uterus:  normal size, contour, position, consistency, mobility, non-tender              Adnexa: normal adnexa and no mass, fullness, tenderness               Rectovaginal: Confirms               Anus:  normal sphincter tone, no lesions  Chaperone was present for exam.  A:  Well Woman with normal exam Remote hx of LGSIL pap Mirena IUD.  2nd one placed 12/12. Migraines Left arm tingling/numbness.  Neg nerve conduction studies. On Lamisil.  P:   Mammogram recommended yearly.  Pap with neg HR 2/14.  Pap only today. Counseled on self breast exam, Calcium and vitamin D intake, exercise. Pt has seen neurologist.  Expecting phone call today.  Mother with hx of MS.  D/W pt possible reasons for symptoms.  She will  call and give me update after phone call from neurologist today. Liver profile today. Return annually or prn

## 2014-06-28 NOTE — Telephone Encounter (Signed)
Talked with Larene Beach and she said she will get this taken care of and will call pt.

## 2014-06-28 NOTE — Addendum Note (Signed)
Addended by: Megan Salon on: 06/28/2014 11:28 AM   Modules accepted: Miquel Dunn

## 2014-07-01 LAB — IPS PAP TEST WITH REFLEX TO HPV

## 2014-07-03 ENCOUNTER — Ambulatory Visit
Admission: RE | Admit: 2014-07-03 | Discharge: 2014-07-03 | Disposition: A | Discharge status: Home / Self Care | Payer: 59 | Source: Ambulatory Visit | Attending: Neurology | Admitting: Neurology

## 2014-07-03 DIAGNOSIS — R202 Paresthesia of skin: Secondary | ICD-10-CM

## 2014-07-03 DIAGNOSIS — R531 Weakness: Secondary | ICD-10-CM

## 2014-07-03 MED ORDER — GADOBENATE DIMEGLUMINE 529 MG/ML IV SOLN
11.0000 mL | Freq: Once | INTRAVENOUS | Status: AC | PRN
  Administered 2014-07-03: 11 mL via INTRAVENOUS

## 2014-07-04 ENCOUNTER — Telehealth: Payer: Self-pay | Admitting: Neurology

## 2014-07-04 NOTE — Telephone Encounter (Signed)
Patient is calling to get MRI results. °

## 2014-07-05 ENCOUNTER — Telehealth: Payer: Self-pay | Admitting: Neurology

## 2014-07-05 NOTE — Telephone Encounter (Signed)
Left message for patient, would like to talk to her about her imaging. Left messages at home and mobile. Asked her to call u sback, thanks.

## 2014-07-05 NOTE — Telephone Encounter (Signed)
Called patient back, left 2 messages. Will try again monday

## 2014-07-08 NOTE — Telephone Encounter (Signed)
Called patient on home and cell. No answer at either. Left messages again.

## 2014-07-08 NOTE — Telephone Encounter (Signed)
Patient returning call regarding Imaging results.  Please return call on mobile # [phone] due to out of town.

## 2014-07-09 ENCOUNTER — Other Ambulatory Visit: Payer: Self-pay | Admitting: Neurology

## 2014-07-09 DIAGNOSIS — M5010 Cervical disc disorder with radiculopathy, unspecified cervical region: Secondary | ICD-10-CM

## 2014-07-09 MED ORDER — METHYLPREDNISOLONE 4 MG PO KIT: PACK | ORAL | Status: DC

## 2014-07-16 ENCOUNTER — Ambulatory Visit: Payer: 59 | Admitting: Neurology

## 2014-07-24 ENCOUNTER — Telehealth: Payer: Self-pay | Admitting: Neurology

## 2014-07-24 NOTE — Telephone Encounter (Signed)
That would be fine. Please le there know, thanks

## 2014-07-24 NOTE — Telephone Encounter (Signed)
Patient is calling to see if it is ok for her to start taking Topiramate again for migraines. Please call and advise. Thank you.

## 2014-07-25 NOTE — Telephone Encounter (Signed)
Rx added back to med list.  I called back to advise.  Got no answer.  Left message.

## 2014-08-28 ENCOUNTER — Other Ambulatory Visit: Payer: Self-pay

## 2014-08-28 DIAGNOSIS — Z1231 Encounter for screening mammogram for malignant neoplasm of breast: Secondary | ICD-10-CM

## 2014-09-20 ENCOUNTER — Ambulatory Visit
Admission: RE | Admit: 2014-09-20 | Discharge: 2014-09-20 | Disposition: A | Discharge status: Home / Self Care | Payer: 59 | Source: Ambulatory Visit

## 2014-09-20 DIAGNOSIS — Z1231 Encounter for screening mammogram for malignant neoplasm of breast: Secondary | ICD-10-CM

## 2014-10-25 ENCOUNTER — Telehealth: Payer: Self-pay | Admitting: Neurology

## 2014-10-25 NOTE — Telephone Encounter (Signed)
I am happy to do it. But some insurances need her primary care to give her a referral. Just to be safe, I would ask her pcp. But when she gets an appointment, we are happy to forward all our notes. Have her let us know who and when her appointment is with. If she is sure her insurance won't mind a referral from [place], I am happy to do it. Let me know. thanks

## 2014-10-25 NOTE — Telephone Encounter (Signed)
Pt called and would like referral to Dr. Maxie Better , Orthopedic. Pt has continued tingleing from left hand to her shoulder. Please call and advise [phone].

## 2014-10-25 NOTE — Telephone Encounter (Signed)
Ok for referral or should she get from PCP?

## 2014-10-25 NOTE — Telephone Encounter (Signed)
Left message with below information. Left call back number for any further questions.

## 2015-02-06 NOTE — Telephone Encounter (Signed)
Error

## 2015-08-26 ENCOUNTER — Other Ambulatory Visit: Payer: Self-pay

## 2015-08-26 DIAGNOSIS — Z1231 Encounter for screening mammogram for malignant neoplasm of breast: Secondary | ICD-10-CM

## 2015-09-05 ENCOUNTER — Ambulatory Visit: Payer: 59 | Admitting: Obstetrics & Gynecology

## 2015-09-24 ENCOUNTER — Ambulatory Visit
Admission: RE | Admit: 2015-09-24 | Discharge: 2015-09-24 | Disposition: A | Discharge status: Home / Self Care | Payer: 59 | Source: Ambulatory Visit

## 2015-09-24 DIAGNOSIS — Z1231 Encounter for screening mammogram for malignant neoplasm of breast: Secondary | ICD-10-CM

## 2015-10-06 ENCOUNTER — Ambulatory Visit (INDEPENDENT_AMBULATORY_CARE_PROVIDER_SITE_OTHER): Payer: 59 | Admitting: Obstetrics & Gynecology

## 2015-10-06 ENCOUNTER — Encounter: Payer: Self-pay | Admitting: Obstetrics & Gynecology

## 2015-10-06 VITALS — BP 90/60 | HR 60 | Resp 14 | Ht 68.0 in | Wt 126.8 lb

## 2015-10-06 DIAGNOSIS — Z01419 Encounter for gynecological examination (general) (routine) without abnormal findings: Secondary | ICD-10-CM | POA: Diagnosis not present

## 2015-10-06 NOTE — Progress Notes (Signed)
45 y.o. G2P2 MarriedCaucasianF here for annual exam.  Doing well.  Does not have bleeding due to Mirena IUD.    Pt had left hand numbness and tingling last year.  Ended up having two injections in neck and this "fixed" the problems.  PCP:  Dr. Reynaldo Minium.  Has appt tomorrow.  Did blood work last weeks.     No LMP recorded. Patient is not currently having periods (Reason: IUD).          Sexually active: Yes.    The current method of family planning is IUD.    Exercising: Yes.    walking occasionally  Smoker:  no  Health Maintenance: Pap:  06/28/2014 negative, neg HR HPV 2014 History of abnormal Pap:  yes MMG:  09/24/2015 BIRADS 1 negative  Colonoscopy:  Never BMD:   never TDaP:  06/20/2013  Pneumonia vaccine(s):  never Zostavax:   never Hep C testing: not indicated Screening Labs: PCP, Hb today: PCP, Urine today: PCP   reports that she has never smoked. She has never used smokeless tobacco. She reports that she drinks about 6.0 oz of alcohol per week. She reports that she does not use illicit drugs.  Past Medical History  Diagnosis Date  . Migraines   . Abnormal Pap smear of cervix 2010  . Encounter for insertion of mirena IUD 03/2011  . Depression   . Anxiety   . Anaphylactic reaction     x 2   (hospitalized)  . STD (sexually transmitted disease)     HPV    Past Surgical History  Procedure Laterality Date  . Colposcopy  02/2009    LGSIL on Bx  . Cesarean section  2004 ,2006    x 2    Current Outpatient Prescriptions  Medication Sig Dispense Refill  . citalopram (CELEXA) 20 MG tablet Take 20 mg by mouth daily.  3  . levonorgestrel (MIRENA) 20 MCG/24HR IUD 1 each by Intrauterine route once.    . methylPREDNISolone (MEDROL DOSEPAK) 4 MG tablet follow package directions 21 tablet 0  . rizatriptan (MAXALT-MLT) 10 MG disintegrating tablet Take 10 mg by mouth as needed.    . terbinafine (LAMISIL) 250 MG tablet Take 1 tablet by mouth daily.    Marland Kitchen topiramate (TOPAMAX) 25 MG  tablet Take 50 mg by mouth daily. As directed    . zolpidem (AMBIEN CR) 12.5 MG CR tablet Take 12.5 mg by mouth at bedtime.  1   No current facility-administered medications for this visit.    Family History  Problem Relation Age of Onset  . Hypertension Brother   . Diabetes Brother     AODM  . Cancer Paternal Grandfather 26    dec  . Multiple sclerosis Mother     ROS:  Pertinent items are noted in HPI.  Otherwise, a comprehensive ROS was negative.  Exam:   Filed Vitals:   10/06/15 1250  BP: 90/60  Pulse: 60  Resp: 14  Height: 5\' 8"  (1.727 m)  Weight: 126 lb 12.8 oz (57.516 kg)    General appearance: alert, cooperative and appears stated age Head: Normocephalic, without obvious abnormality, atraumatic Neck: no adenopathy, supple, symmetrical, trachea midline and thyroid normal to inspection and palpation Lungs: clear to auscultation bilaterally Breasts: normal appearance, no masses or tenderness Heart: regular rate and rhythm Abdomen: soft, non-tender; bowel sounds normal; no masses,  no organomegaly Extremities: extremities normal, atraumatic, no cyanosis or edema Skin: Skin color, texture, turgor normal. No rashes or lesions Lymph nodes:  Cervical, supraclavicular, and axillary nodes normal. No abnormal inguinal nodes palpated Neurologic: Grossly normal   Pelvic: External genitalia:  no lesions              Urethra:  normal appearing urethra with no masses, tenderness or lesions              Bartholins and Skenes: normal                 Vagina: normal appearing vagina with normal color and discharge, no lesions              Cervix: no lesions              Pap taken: No. Bimanual Exam:  Uterus:  normal size, contour, position, consistency, mobility, non-tender              Adnexa: normal adnexa and no mass, fullness, tenderness               Rectovaginal: Confirms               Anus:  normal sphincter tone, no lesions  Chaperone was present for exam.  A:  Well  Woman with normal exam Remote hx of LGSIL pap, 2010 Mirena IUD. 2nd one placed 12/12.  Due for removal and replacement in December Migraines that is significantly improved this past year H/O depression  P: Mammogram recommended yearly. Doing 3D due to Grade 4 dense breast tissue Pap with neg HR 2/14. Neg pap 2016.  No pap today. Labs and vaccines done with Dr. Reynaldo Minium Return annually or prn

## 2015-10-16 ENCOUNTER — Ambulatory Visit: Payer: 59 | Admitting: Obstetrics & Gynecology

## 2016-02-24 ENCOUNTER — Telehealth: Payer: Self-pay | Admitting: Obstetrics & Gynecology

## 2016-02-24 DIAGNOSIS — Z30430 Encounter for insertion of intrauterine contraceptive device: Secondary | ICD-10-CM

## 2016-02-24 DIAGNOSIS — Z30432 Encounter for removal of intrauterine contraceptive device: Secondary | ICD-10-CM

## 2016-02-24 NOTE — Telephone Encounter (Signed)
It's impossible to know if the headache is from a cycle that she is not bleeding with. She should try ibuprofen for her headache, if not helping contact her primary about treatment of her headaches.

## 2016-02-24 NOTE — Telephone Encounter (Signed)
Spoke with patient, advised of recommendations as seen below per Dr. Talbert Nan. Patient verbalizes understanding and is agreeable.  Routing to provider for final review. Patient is agreeable to disposition. Will close encounter.

## 2016-02-24 NOTE — Telephone Encounter (Signed)
Spoke with patient. Patient states she can tell when she is at the end of her cycle even though no bleeding because of the cramping. Patient states she has had a horrible headache, but not a migraine, this week and wonders if part of her cycle. Patient denies any vaginal bleeding. Patient reports no longer taking topamax for migraines. Last AEX 10/06/15 with Dr. Sabra Heck, RN advised IUD due to be replaced in December 2017 -patient scheduled for IUD removal/insertion 04/09/16 at 10am with Dr. Sabra Heck. Advised Motrin 800 mg with food and water one hour before procedure. Advised Dr. Sabra Heck out of the office until 11/16 -will review with covering provider for additional recommendations and return call. Patient is agreeable.   Dr. Talbert Nan, please advise on headache?   Orders placed for IUD removal/insertion.  A:         Well Woman with normal exam Remote hx of LGSIL pap, 2010 Mirena IUD. 2nd one placed 12/12.  Due for removal and replacement in December Migraines that is significantly improved this past year H/O depression  Cc:Dr. Sabra Heck; Theresia Lo

## 2016-02-24 NOTE — Telephone Encounter (Signed)
Patient is having headaches and cramping with her IUD.

## 2016-02-24 NOTE — Telephone Encounter (Signed)
Left message to call Sandra Juarez at [phone].  

## 2016-03-08 ENCOUNTER — Other Ambulatory Visit: Payer: Self-pay | Admitting: *Deleted

## 2016-03-08 DIAGNOSIS — Z30433 Encounter for removal and reinsertion of intrauterine contraceptive device: Secondary | ICD-10-CM

## 2016-04-09 ENCOUNTER — Ambulatory Visit (INDEPENDENT_AMBULATORY_CARE_PROVIDER_SITE_OTHER): Payer: 59 | Admitting: Obstetrics & Gynecology

## 2016-04-09 ENCOUNTER — Encounter: Payer: Self-pay | Admitting: Obstetrics & Gynecology

## 2016-04-09 VITALS — BP 102/70 | HR 66 | Resp 14 | Ht 67.0 in | Wt 127.0 lb

## 2016-04-09 DIAGNOSIS — Z30433 Encounter for removal and reinsertion of intrauterine contraceptive device: Secondary | ICD-10-CM | POA: Diagnosis not present

## 2016-04-09 DIAGNOSIS — Z30430 Encounter for insertion of intrauterine contraceptive device: Secondary | ICD-10-CM

## 2016-04-09 DIAGNOSIS — Z30432 Encounter for removal of intrauterine contraceptive device: Secondary | ICD-10-CM

## 2016-04-09 NOTE — Progress Notes (Signed)
45 y.o. G2P2 Married Caucasian female presents for removal of Mirena IUD and re-insertion of IUD.  She has been very happy with her Mirena IUD.   Pt has also been counseled about risks and benefits as well as complications.  Consent is obtained today.  All questions answered prior to start of procedure.    LMP:  No LMP recorded. Patient is not currently having periods (Reason: IUD).  Patient Active Problem List   Diagnosis Date Noted  . Paresthesias 06/12/2014  . Migraine 06/12/2014  . Thrombosed external hemorrhoid-left lateral 07/30/2011   Past Medical History:  Diagnosis Date  . Abnormal Pap smear of cervix 2010  . Anaphylactic reaction    x 2   (hospitalized)  . Anxiety   . Depression   . Encounter for insertion of mirena IUD 03/2011  . Migraines   . STD (sexually transmitted disease)    HPV   Current Outpatient Prescriptions on File Prior to Visit  Medication Sig Dispense Refill  . citalopram (CELEXA) 20 MG tablet Take 20 mg by mouth daily.  3  . EPIPEN 2-PAK 0.3 MG/0.3ML SOAJ injection See admin instructions.  6  . levonorgestrel (MIRENA) 20 MCG/24HR IUD 1 each by Intrauterine route once.    . rizatriptan (MAXALT-MLT) 10 MG disintegrating tablet Take 10 mg by mouth as needed. Reported on 10/06/2015    . zolpidem (AMBIEN CR) 12.5 MG CR tablet Take 12.5 mg by mouth at bedtime.  1   No current facility-administered medications on file prior to visit.    Lidocaine  ROS Vitals:   04/09/16 1013  BP: 102/70  Pulse: 66  Resp: 14  Weight: 127 lb (57.6 kg)  Height: 5\' 7"  (1.702 m)    Gen:  WNWF healthy female NAD Abdomen: soft, non-tender Groin:  no inguinal nodes palpated  Pelvic exam: Vulva:  normal female genitalia Vagina:  normal vagina Cervix:  Non-tender, Negative CMT, no lesions or redness. Uterus:  normal shape, position and consistency   Procedure:  Speculum reinserted.  Cervix visualized and cleansed with Betadine x 3.  Paracervical block was not placed.   Single toothed tenaculum applied to anterior lip of cervix without difficulty.  IUD string noted and grasped with ringed forcep.  With one pull, IUD removed easily.  Then uterus sounded to 8cm.  Lot number: [id number].  Expiration:  6/20.  IUD package was opened.  IUD and introducer passed to fundus and then withdrawn slightly before IUD was passed into endometrial cavity.  Introducer removed.  Strings cut to 2cm.  Tenaculum removed from cervix.  Minimal bleeding noted.  Pt tolerated the procedure well.  All instruments removed from vagina.  A: Removal of Mirena IUD and reinsertion of Mirena IUD Contraception desires  P:  Return for recheck 6-8 weeks Pt aware to call for any concerns Pt aware removal due no later than 04/09/2021.  IUD card given to pt.

## 2016-08-28 DIAGNOSIS — S61216A Laceration without foreign body of right little finger without damage to nail, initial encounter: Secondary | ICD-10-CM | POA: Diagnosis not present

## 2016-08-28 DIAGNOSIS — M6530 Trigger finger, unspecified finger: Secondary | ICD-10-CM | POA: Diagnosis not present

## 2016-08-30 DIAGNOSIS — S61216A Laceration without foreign body of right little finger without damage to nail, initial encounter: Secondary | ICD-10-CM | POA: Diagnosis not present

## 2016-08-31 ENCOUNTER — Other Ambulatory Visit: Payer: Self-pay | Admitting: Obstetrics & Gynecology

## 2016-08-31 DIAGNOSIS — Z1231 Encounter for screening mammogram for malignant neoplasm of breast: Secondary | ICD-10-CM

## 2016-09-24 ENCOUNTER — Ambulatory Visit
Admission: RE | Admit: 2016-09-24 | Discharge: 2016-09-24 | Disposition: A | Discharge status: Home / Self Care | Payer: 59 | Source: Ambulatory Visit | Attending: Obstetrics & Gynecology | Admitting: Obstetrics & Gynecology

## 2016-09-24 DIAGNOSIS — Z1231 Encounter for screening mammogram for malignant neoplasm of breast: Secondary | ICD-10-CM | POA: Diagnosis not present

## 2016-10-08 DIAGNOSIS — S61216D Laceration without foreign body of right little finger without damage to nail, subsequent encounter: Secondary | ICD-10-CM | POA: Diagnosis not present

## 2016-10-10 DIAGNOSIS — S61216A Laceration without foreign body of right little finger without damage to nail, initial encounter: Secondary | ICD-10-CM

## 2016-10-10 HISTORY — PX: FINGER SURGERY: SHX640

## 2016-10-10 HISTORY — DX: Laceration without foreign body of right little finger without damage to nail, initial encounter: S61.216A

## 2016-10-18 DIAGNOSIS — S61216D Laceration without foreign body of right little finger without damage to nail, subsequent encounter: Secondary | ICD-10-CM | POA: Diagnosis not present

## 2016-10-28 DIAGNOSIS — R8290 Unspecified abnormal findings in urine: Secondary | ICD-10-CM | POA: Diagnosis not present

## 2016-10-28 DIAGNOSIS — N390 Urinary tract infection, site not specified: Secondary | ICD-10-CM | POA: Diagnosis not present

## 2016-10-28 DIAGNOSIS — S61216D Laceration without foreign body of right little finger without damage to nail, subsequent encounter: Secondary | ICD-10-CM | POA: Diagnosis not present

## 2016-11-01 DIAGNOSIS — Z1389 Encounter for screening for other disorder: Secondary | ICD-10-CM | POA: Diagnosis not present

## 2016-11-01 DIAGNOSIS — R5381 Other malaise: Secondary | ICD-10-CM | POA: Diagnosis not present

## 2016-11-01 DIAGNOSIS — Z23 Encounter for immunization: Secondary | ICD-10-CM | POA: Diagnosis not present

## 2016-11-01 DIAGNOSIS — G4700 Insomnia, unspecified: Secondary | ICD-10-CM | POA: Diagnosis not present

## 2016-11-01 DIAGNOSIS — I8390 Asymptomatic varicose veins of unspecified lower extremity: Secondary | ICD-10-CM | POA: Diagnosis not present

## 2016-11-01 DIAGNOSIS — Z0000 Encounter for general adult medical examination without abnormal findings: Secondary | ICD-10-CM | POA: Diagnosis not present

## 2016-11-03 DIAGNOSIS — Z1212 Encounter for screening for malignant neoplasm of rectum: Secondary | ICD-10-CM | POA: Diagnosis not present

## 2016-11-05 DIAGNOSIS — S61216D Laceration without foreign body of right little finger without damage to nail, subsequent encounter: Secondary | ICD-10-CM | POA: Diagnosis not present

## 2016-12-30 DIAGNOSIS — S61216D Laceration without foreign body of right little finger without damage to nail, subsequent encounter: Secondary | ICD-10-CM | POA: Diagnosis not present

## 2017-01-14 ENCOUNTER — Other Ambulatory Visit (HOSPITAL_COMMUNITY)
Admission: RE | Admit: 2017-01-14 | Discharge: 2017-01-14 | Disposition: A | Discharge status: Home / Self Care | Payer: 59 | Source: Ambulatory Visit | Attending: Obstetrics & Gynecology | Admitting: Obstetrics & Gynecology

## 2017-01-14 ENCOUNTER — Encounter: Payer: Self-pay | Admitting: Obstetrics & Gynecology

## 2017-01-14 ENCOUNTER — Ambulatory Visit (INDEPENDENT_AMBULATORY_CARE_PROVIDER_SITE_OTHER): Payer: 59 | Admitting: Obstetrics & Gynecology

## 2017-01-14 VITALS — BP 92/60 | HR 72 | Resp 14 | Ht 67.0 in | Wt 127.0 lb

## 2017-01-14 DIAGNOSIS — Z124 Encounter for screening for malignant neoplasm of cervix: Secondary | ICD-10-CM

## 2017-01-14 DIAGNOSIS — Z01419 Encounter for gynecological examination (general) (routine) without abnormal findings: Secondary | ICD-10-CM

## 2017-01-14 NOTE — Progress Notes (Signed)
46 y.o. G2P2 MarriedCaucasianF here for annual exam.  Doing well.  Denies vaginal bleeding.  Has Mirena IUD, placed 04/09/16.  Had laceration of finger on right hand.  Ended up having surgery on this finger.  Had tetanus updated.  PCP:  Dr. Reynaldo Minium.  Blood work was all normal.    No LMP recorded. Patient is not currently having periods (Reason: IUD).          Sexually active: Yes.    The current method of family planning is IUD. Placed 04/09/16    Exercising: No.  walking Smoker:  no  Health Maintenance: Pap:  06/28/14 Neg  2014 HR HPV:neg  History of abnormal Pap:  Yes,  Remote hx LGSIL MMG:  09/27/16 BIRADS1:neg  Colonoscopy:  Never.  Reviewed new ACS guidelines. BMD:   Never TDaP:  06/2013  Screening Labs: PCP   reports that she has never smoked. She has never used smokeless tobacco. She reports that she drinks about 6.0 oz of alcohol per week . She reports that she does not use drugs.  Past Medical History:  Diagnosis Date  . Abnormal Pap smear of cervix 2010  . Anaphylactic reaction    x 2   (hospitalized)  . Anxiety   . Depression   . Encounter for insertion of mirena IUD 03/2011, 03/2016  . Laceration of right little finger 10/2016  . Migraines   . STD (sexually transmitted disease)    HPV    Past Surgical History:  Procedure Laterality Date  . CESAREAN SECTION  2004 ,2006   x 2  . COLPOSCOPY  02/2009   LGSIL on Bx  . FINGER SURGERY Right 10/2016   Little finger    Current Outpatient Prescriptions  Medication Sig Dispense Refill  . citalopram (CELEXA) 20 MG tablet Take 20 mg by mouth daily.  3  . EPIPEN 2-PAK 0.3 MG/0.3ML SOAJ injection See admin instructions.  6  . levonorgestrel (MIRENA) 20 MCG/24HR IUD 1 each by Intrauterine route once.    Marland Kitchen zolpidem (AMBIEN CR) 6.25 MG CR tablet      No current facility-administered medications for this visit.     Family History  Problem Relation Age of Onset  . Hypertension Brother   . Diabetes Brother        AODM   . Multiple sclerosis Mother   . Cancer Paternal Grandfather 50       dec    ROS:  Pertinent items are noted in HPI.  Otherwise, a comprehensive ROS was negative.  Exam:   BP 92/60 (BP Location: Right Arm, Patient Position: Sitting, Cuff Size: Normal)   Pulse 72   Resp 14   Ht 5\' 7"  (1.702 m)   Wt 127 lb (57.6 kg)   BMI 19.89 kg/m    Height: 5\' 7"  (170.2 cm)  Ht Readings from Last 3 Encounters:  01/14/17 5\' 7"  (1.702 m)  04/09/16 5\' 7"  (1.702 m)  10/06/15 5\' 8"  (1.727 m)    General appearance: alert, cooperative and appears stated age Head: Normocephalic, without obvious abnormality, atraumatic Neck: no adenopathy, supple, symmetrical, trachea midline and thyroid normal to inspection and palpation Lungs: clear to auscultation bilaterally Breasts: normal appearance, no masses or tenderness Heart: regular rate and rhythm Abdomen: soft, non-tender; bowel sounds normal; no masses,  no organomegaly Extremities: extremities normal, atraumatic, no cyanosis or edema Skin: Skin color, texture, turgor normal. No rashes or lesions Lymph nodes: Cervical, supraclavicular, and axillary nodes normal. No abnormal inguinal nodes palpated Neurologic: Grossly  normal  Pelvic: External genitalia:  no lesions              Urethra:  normal appearing urethra with no masses, tenderness or lesions              Bartholins and Skenes: normal                 Vagina: normal appearing vagina with normal color and discharge, no lesions              Cervix: no lesions and IUD string noted              Pap taken: Yes.   Bimanual Exam:  Uterus:  normal size, contour, position, consistency, mobility, non-tender              Adnexa: normal adnexa and no mass, fullness, tenderness               Rectovaginal: Confirms               Anus:  normal sphincter tone, no lesions  Chaperone was present for exam.  A:  Well Woman with normal exam H/O LGSIL, 2010 Mirena IUD.  3rd placed 12/17. Migraines H/O  depression  P:   Mammogram recommended yearly.  Doing 3D with hx of Grade D breast density pap smear and HR obtained today Labs/vaccines UTD return annually or prn

## 2017-01-17 DIAGNOSIS — Z23 Encounter for immunization: Secondary | ICD-10-CM | POA: Diagnosis not present

## 2017-01-18 LAB — CYTOLOGY - PAP
DIAGNOSIS: NEGATIVE
HPV (WINDOPATH): NOT DETECTED

## 2017-04-13 DIAGNOSIS — R110 Nausea: Secondary | ICD-10-CM | POA: Diagnosis not present

## 2017-04-13 DIAGNOSIS — R109 Unspecified abdominal pain: Secondary | ICD-10-CM | POA: Diagnosis not present

## 2017-04-14 ENCOUNTER — Other Ambulatory Visit: Payer: Self-pay | Admitting: Internal Medicine

## 2017-04-14 DIAGNOSIS — R110 Nausea: Secondary | ICD-10-CM

## 2017-04-14 DIAGNOSIS — R109 Unspecified abdominal pain: Secondary | ICD-10-CM

## 2017-04-15 ENCOUNTER — Other Ambulatory Visit: Payer: 59

## 2017-04-19 ENCOUNTER — Ambulatory Visit
Admission: RE | Admit: 2017-04-19 | Discharge: 2017-04-19 | Disposition: A | Discharge status: Home / Self Care | Payer: 59 | Source: Ambulatory Visit | Attending: Internal Medicine | Admitting: Internal Medicine

## 2017-04-19 DIAGNOSIS — R110 Nausea: Secondary | ICD-10-CM

## 2017-04-19 DIAGNOSIS — R109 Unspecified abdominal pain: Secondary | ICD-10-CM | POA: Diagnosis not present

## 2017-08-29 ENCOUNTER — Other Ambulatory Visit: Payer: Self-pay | Admitting: Obstetrics & Gynecology

## 2017-08-29 DIAGNOSIS — Z1231 Encounter for screening mammogram for malignant neoplasm of breast: Secondary | ICD-10-CM

## 2017-10-04 DIAGNOSIS — D3132 Benign neoplasm of left choroid: Secondary | ICD-10-CM | POA: Diagnosis not present

## 2017-10-06 ENCOUNTER — Ambulatory Visit
Admission: RE | Admit: 2017-10-06 | Discharge: 2017-10-06 | Disposition: A | Discharge status: Home / Self Care | Payer: 59 | Source: Ambulatory Visit | Attending: Obstetrics & Gynecology | Admitting: Obstetrics & Gynecology

## 2017-10-06 DIAGNOSIS — Z1231 Encounter for screening mammogram for malignant neoplasm of breast: Secondary | ICD-10-CM

## 2017-10-18 ENCOUNTER — Encounter: Payer: Self-pay | Admitting: Obstetrics & Gynecology

## 2017-10-18 ENCOUNTER — Ambulatory Visit (INDEPENDENT_AMBULATORY_CARE_PROVIDER_SITE_OTHER): Payer: 59 | Admitting: Obstetrics & Gynecology

## 2017-10-18 ENCOUNTER — Telehealth: Payer: Self-pay | Admitting: Obstetrics & Gynecology

## 2017-10-18 ENCOUNTER — Other Ambulatory Visit: Payer: Self-pay

## 2017-10-18 ENCOUNTER — Ambulatory Visit (INDEPENDENT_AMBULATORY_CARE_PROVIDER_SITE_OTHER): Payer: 59

## 2017-10-18 VITALS — BP 94/62 | HR 60 | Resp 16 | Ht 67.0 in | Wt 132.0 lb

## 2017-10-18 DIAGNOSIS — R102 Pelvic and perineal pain: Secondary | ICD-10-CM

## 2017-10-18 DIAGNOSIS — N83202 Unspecified ovarian cyst, left side: Secondary | ICD-10-CM | POA: Diagnosis not present

## 2017-10-18 DIAGNOSIS — T8332XA Displacement of intrauterine contraceptive device, initial encounter: Secondary | ICD-10-CM

## 2017-10-18 LAB — POCT URINE PREGNANCY: Preg Test, Ur: NEGATIVE

## 2017-10-18 LAB — POCT URINALYSIS DIPSTICK
BILIRUBIN UA: NEGATIVE
GLUCOSE UA: NEGATIVE
KETONES UA: NEGATIVE
Nitrite, UA: NEGATIVE
PH UA: 5 (ref 5.0–8.0)
Protein, UA: NEGATIVE
Urobilinogen, UA: 0.2 E.U./dL

## 2017-10-18 MED ORDER — HYDROCODONE-ACETAMINOPHEN 5-325 MG PO TABS: 1.0000 | ORAL_TABLET | Freq: Four times a day (QID) | ORAL | 0 refills | Status: AC | PRN

## 2017-10-18 NOTE — Telephone Encounter (Signed)
Patient is having abdominal pain with chills for days. Patient would like to come in today to see Dr.Miller.

## 2017-10-18 NOTE — Telephone Encounter (Signed)
Spoke with patient. Patient reports constant, dull, achy pain in lower abdomen and pelvis since 7/5. Feels like menses is going to start. IUD for contraceptive, no menses with IUD. 7/10 on pain scale. Intermittent chills, no fever. BM normal, last BM 7/8.  No relief with OTC pain medication or antacid. Patient requesting OV.   Denies N/V, urinary complaints, vaginal bleeding.   OV scheduled for today at 11:30am with Dr. Sabra Heck.   Routing to provider for final review. Patient is agreeable to disposition. Will close encounter.

## 2017-10-18 NOTE — Progress Notes (Signed)
Results documented in note started prior to ultrasound.

## 2017-10-18 NOTE — Progress Notes (Signed)
GYNECOLOGY  VISIT  CC:   Pelvic pain  HPI: 47 y.o. G2P2 Married Caucasian female here for pelvic pain x 4 days.  Pain started the day after the 4th of July and is colicky but has not been worsening.  States she feels like she has a "stomach ache" but it is just much lower.  Today, the pain has improved.    Denies fever.  On Friday she had a little bit of diarrhea but nothing since.  Denies constipation.  She is having some nausea.  Has felt chilled but denies fever.  Aleve and Tylenol and Advil has not helped the pain.    Has amenorrhea due to Mirena IUD.  GYNECOLOGIC HISTORY: No LMP recorded. (Menstrual status: IUD). Contraception: Mirena IUD placed 04/09/16  Menopausal hormone therapy: none  Patient Active Problem List   Diagnosis Date Noted  . Paresthesias 06/12/2014  . Migraine without aura 06/12/2014  . Thrombosed external hemorrhoid-left lateral 07/30/2011    Past Medical History:  Diagnosis Date  . Abnormal Pap smear of cervix 2010  . Anaphylactic reaction    x 2   (hospitalized)  . Anxiety   . Depression   . Encounter for insertion of mirena IUD 03/2011, 03/2016  . Laceration of right little finger 10/2016  . Migraines   . STD (sexually transmitted disease)    HPV    Past Surgical History:  Procedure Laterality Date  . CESAREAN SECTION  2004 ,2006   x 2  . COLPOSCOPY  02/2009   LGSIL on Bx  . FINGER SURGERY Right 10/2016   Little finger    MEDS:   Current Outpatient Medications on File Prior to Visit  Medication Sig Dispense Refill  . citalopram (CELEXA) 20 MG tablet Take 20 mg by mouth daily.  3  . EPIPEN 2-PAK 0.3 MG/0.3ML SOAJ injection See admin instructions.  6  . levonorgestrel (MIRENA) 20 MCG/24HR IUD 1 each by Intrauterine route once.    Marland Kitchen zolpidem (AMBIEN CR) 6.25 MG CR tablet      No current facility-administered medications on file prior to visit.     ALLERGIES: Lidocaine  Family History  Problem Relation Age of Onset  . Hypertension  Brother   . Diabetes Brother        AODM  . Multiple sclerosis Mother   . Cancer Paternal Grandfather 64       dec    SH:  Married, non smoker  Review of Systems  Gastrointestinal: Positive for abdominal pain and nausea.  Neurological: Positive for headaches.  All other systems reviewed and are negative.   PHYSICAL EXAMINATION:    BP 94/62 (BP Location: Right Arm, Patient Position: Sitting, Cuff Size: Normal)   Pulse 60   Resp 16   Ht 5\' 7"  (1.702 m)   Wt 132 lb (59.9 kg)   BMI 20.67 kg/m     General appearance: alert, cooperative and appears stated age CV:  Regular rate and rhythm Lungs:  clear to auscultation, no wheezes, rales or rhonchi, symmetric air entry Abdomen: soft, mild and generalized tenderness along her lower abdomen but no specific focal tenderness, no rebound or guarding, bowel sounds normal; no masses, no organomegaly  Pelvic: External genitalia:  no lesions              Urethra:  normal appearing urethra with no masses, tenderness or lesions              Bartholins and Skenes: normal  Vagina: normal appearing vagina with normal color and discharge, no lesions              Cervix: no lesions and cannot see IUD strings today              Bimanual Exam:  Uterus:  normal size, contour, position, consistency, mobility, non-tender              Adnexa: mild right adnexal tenderness, no masses or fullness  Chaperone was present for exam.  Ultrasound was recommended.  She returned 45 minutes later for this to as it could be done in-office today.  Ultrasound findings: Uterus: 7.7 x 6.2 x 4.6cm, IUD in correct locatoin Endometrium: symmetric.  IUD string noted 1.5cm from external os of cervix Left ovary: 4.1 x 3.5 x 2.7cm with 3.0 x 2.0 x 2.1cm thick-walled cyst Right ovary: 2.9 x 1.9 x 1.9cm Cul de sac: no free fluid  Assessment: Pelvic pain 3.0cm simple cyst Non visualized IUD string  Plan: Rx for Vicodin 5/325 1-2 tabs every 4-6 hours as  needed for pt.   Advised pt to call back with any worsening symptoms or if pain does not completely resolve.  Questions answered.      ~30 minutes spent with patient >50% of time was in face to face discussion of above.

## 2017-11-02 DIAGNOSIS — Z0000 Encounter for general adult medical examination without abnormal findings: Secondary | ICD-10-CM | POA: Diagnosis not present

## 2017-11-02 DIAGNOSIS — R82998 Other abnormal findings in urine: Secondary | ICD-10-CM | POA: Diagnosis not present

## 2017-11-09 DIAGNOSIS — Z0000 Encounter for general adult medical examination without abnormal findings: Secondary | ICD-10-CM | POA: Diagnosis not present

## 2017-11-09 DIAGNOSIS — G4709 Other insomnia: Secondary | ICD-10-CM | POA: Diagnosis not present

## 2017-11-09 DIAGNOSIS — B351 Tinea unguium: Secondary | ICD-10-CM | POA: Diagnosis not present

## 2017-11-09 DIAGNOSIS — Z1389 Encounter for screening for other disorder: Secondary | ICD-10-CM | POA: Diagnosis not present

## 2017-11-09 DIAGNOSIS — R209 Unspecified disturbances of skin sensation: Secondary | ICD-10-CM | POA: Diagnosis not present

## 2017-11-15 DIAGNOSIS — Z1212 Encounter for screening for malignant neoplasm of rectum: Secondary | ICD-10-CM | POA: Diagnosis not present

## 2018-01-31 DIAGNOSIS — Z23 Encounter for immunization: Secondary | ICD-10-CM | POA: Diagnosis not present

## 2018-02-17 ENCOUNTER — Encounter: Payer: Self-pay | Admitting: Obstetrics & Gynecology

## 2018-02-17 ENCOUNTER — Other Ambulatory Visit: Payer: Self-pay

## 2018-02-17 ENCOUNTER — Ambulatory Visit (INDEPENDENT_AMBULATORY_CARE_PROVIDER_SITE_OTHER): Payer: 59 | Admitting: Obstetrics & Gynecology

## 2018-02-17 ENCOUNTER — Ambulatory Visit: Payer: 59 | Admitting: Obstetrics & Gynecology

## 2018-02-17 VITALS — BP 108/72 | HR 60 | Resp 16 | Ht 67.0 in | Wt 121.0 lb

## 2018-02-17 DIAGNOSIS — Z01419 Encounter for gynecological examination (general) (routine) without abnormal findings: Secondary | ICD-10-CM | POA: Diagnosis not present

## 2018-02-17 NOTE — Progress Notes (Signed)
47 y.o. G2P2 Married White or Caucasian female here for annual exam.  Doing well.  Sons are 8th and 7th grade.  Does not have any vaginal bleeding.  Current IUD place 04/09/16.  Had an ovarian cyst this summer.  Took a few weeks for this to resolve.   No LMP recorded. (Menstrual status: IUD).          Sexually active: Yes.    The current method of family planning is IUD. Mirena placed 04/09/16  Exercising: Yes.    walk Smoker:  no  Health Maintenance: Pap:  01/14/17 Neg. HR HPV:neg   06/28/14 neg  History of abnormal Pap:  Yes, LGSIL  MMG:  10/06/17 BIRADS1:Neg  Colonoscopy:  Never.  Did stool test for blood with Dr. Reynaldo Minium. BMD:   Never TDaP:  2015 Screening Labs: Dr. Reynaldo Minium   reports that she has never smoked. She has never used smokeless tobacco. She reports that she drinks about 10.0 standard drinks of alcohol per week. She reports that she does not use drugs.  Past Medical History:  Diagnosis Date  . Abnormal Pap smear of cervix 2010  . Anaphylactic reaction    x 2   (hospitalized)  . Anxiety   . Depression   . Encounter for insertion of mirena IUD 03/2011, 03/2016  . Laceration of right little finger 10/2016  . Migraines   . STD (sexually transmitted disease)    HPV    Past Surgical History:  Procedure Laterality Date  . CESAREAN SECTION  2004 ,2006   x 2  . COLPOSCOPY  02/2009   LGSIL on Bx  . FINGER SURGERY Right 10/2016   Little finger    Current Outpatient Medications  Medication Sig Dispense Refill  . citalopram (CELEXA) 20 MG tablet Take 20 mg by mouth daily.  3  . levonorgestrel (MIRENA) 20 MCG/24HR IUD 1 each by Intrauterine route once.    . rizatriptan (MAXALT-MLT) 10 MG disintegrating tablet Take 1 tablet by mouth daily as needed.    . zolpidem (AMBIEN CR) 6.25 MG CR tablet     . EPIPEN 2-PAK 0.3 MG/0.3ML SOAJ injection See admin instructions.  6   No current facility-administered medications for this visit.     Family History  Problem Relation  Age of Onset  . Hypertension Brother   . Diabetes Brother        AODM  . Multiple sclerosis Mother   . Cancer Paternal Grandfather 37       dec    Review of Systems  All other systems reviewed and are negative.   Exam:   BP 108/72 (BP Location: Right Arm, Patient Position: Sitting, Cuff Size: Normal)   Pulse 60   Resp 16   Ht 5\' 7"  (1.702 m)   Wt 121 lb (54.9 kg)   BMI 18.95 kg/m    Height: 5\' 7"  (170.2 cm)  Ht Readings from Last 3 Encounters:  02/17/18 5\' 7"  (1.702 m)  10/18/17 5\' 7"  (1.702 m)  01/14/17 5\' 7"  (1.702 m)    General appearance: alert, cooperative and appears stated age Head: Normocephalic, without obvious abnormality, atraumatic Neck: no adenopathy, supple, symmetrical, trachea midline and thyroid normal to inspection and palpation Lungs: clear to auscultation bilaterally Breasts: normal appearance, no masses or tenderness Heart: regular rate and rhythm Abdomen: soft, non-tender; bowel sounds normal; no masses,  no organomegaly Extremities: extremities normal, atraumatic, no cyanosis or edema Skin: Skin color, texture, turgor normal. No rashes or lesions Lymph nodes: Cervical,  supraclavicular, and axillary nodes normal. No abnormal inguinal nodes palpated Neurologic: Grossly normal   Pelvic: External genitalia:  no lesions              Urethra:  normal appearing urethra with no masses, tenderness or lesions              Bartholins and Skenes: normal                 Vagina: normal appearing vagina with normal color and discharge, no lesions              Cervix: no lesions              Pap taken: No. Bimanual Exam:  Uterus:  normal size, contour, position, consistency, mobility, non-tender              Adnexa: normal adnexa and no mass, fullness, tenderness               Rectovaginal: Confirms               Anus:  normal sphincter tone, no lesions  Chaperone was present for exam.  A:  Well Woman with normal exam H/O LGSIL, 2010 Mirena IUD  12/17 Migraines H/O depression  P:   Mammogram guidelines reviewed.  3D recommended. pap smear with neg HR HPV 2018.  Not indicated today. Labs and vaccines are UTD return annually or prn

## 2018-08-14 DIAGNOSIS — L718 Other rosacea: Secondary | ICD-10-CM | POA: Diagnosis not present

## 2018-08-14 DIAGNOSIS — I788 Other diseases of capillaries: Secondary | ICD-10-CM | POA: Diagnosis not present

## 2018-09-07 ENCOUNTER — Other Ambulatory Visit: Payer: Self-pay | Admitting: Obstetrics & Gynecology

## 2018-09-07 DIAGNOSIS — Z1231 Encounter for screening mammogram for malignant neoplasm of breast: Secondary | ICD-10-CM

## 2018-10-20 ENCOUNTER — Other Ambulatory Visit: Payer: Self-pay

## 2018-10-20 ENCOUNTER — Ambulatory Visit
Admission: RE | Admit: 2018-10-20 | Discharge: 2018-10-20 | Disposition: A | Discharge status: Home / Self Care | Payer: 59 | Source: Ambulatory Visit | Attending: Obstetrics & Gynecology | Admitting: Obstetrics & Gynecology

## 2018-10-20 DIAGNOSIS — Z1231 Encounter for screening mammogram for malignant neoplasm of breast: Secondary | ICD-10-CM

## 2019-01-26 ENCOUNTER — Other Ambulatory Visit: Payer: Self-pay

## 2019-01-26 DIAGNOSIS — Z20822 Contact with and (suspected) exposure to covid-19: Secondary | ICD-10-CM

## 2019-01-28 LAB — NOVEL CORONAVIRUS, NAA: SARS-CoV-2, NAA: NOT DETECTED

## 2019-03-22 ENCOUNTER — Other Ambulatory Visit: Payer: Self-pay | Admitting: Ophthalmology

## 2019-03-22 DIAGNOSIS — H539 Unspecified visual disturbance: Secondary | ICD-10-CM

## 2019-04-11 ENCOUNTER — Other Ambulatory Visit: Payer: Self-pay | Admitting: Ophthalmology

## 2019-04-17 ENCOUNTER — Ambulatory Visit
Admission: RE | Admit: 2019-04-17 | Discharge: 2019-04-17 | Disposition: A | Discharge status: Home / Self Care | Payer: 59 | Source: Ambulatory Visit | Attending: Ophthalmology | Admitting: Ophthalmology

## 2019-04-17 ENCOUNTER — Other Ambulatory Visit: Payer: Self-pay

## 2019-04-17 DIAGNOSIS — H539 Unspecified visual disturbance: Secondary | ICD-10-CM

## 2019-04-17 MED ORDER — GADOBENATE DIMEGLUMINE 529 MG/ML IV SOLN
11.0000 mL | Freq: Once | INTRAVENOUS | Status: AC | PRN
  Administered 2019-04-17: 11 mL via INTRAVENOUS

## 2019-06-20 ENCOUNTER — Other Ambulatory Visit: Payer: Self-pay

## 2019-06-21 ENCOUNTER — Encounter: Payer: Self-pay | Admitting: Obstetrics & Gynecology

## 2019-06-21 ENCOUNTER — Ambulatory Visit (INDEPENDENT_AMBULATORY_CARE_PROVIDER_SITE_OTHER): Payer: 59 | Admitting: Obstetrics & Gynecology

## 2019-06-21 VITALS — BP 112/60 | HR 68 | Temp 98.2°F | Resp 10 | Ht 67.5 in | Wt 129.0 lb

## 2019-06-21 DIAGNOSIS — Z1211 Encounter for screening for malignant neoplasm of colon: Secondary | ICD-10-CM

## 2019-06-21 DIAGNOSIS — Z01419 Encounter for gynecological examination (general) (routine) without abnormal findings: Secondary | ICD-10-CM | POA: Diagnosis not present

## 2019-06-21 DIAGNOSIS — R232 Flushing: Secondary | ICD-10-CM | POA: Diagnosis not present

## 2019-06-21 DIAGNOSIS — Z8659 Personal history of other mental and behavioral disorders: Secondary | ICD-10-CM

## 2019-06-21 MED ORDER — EPINEPHRINE 0.3 MG/0.3ML IJ SOAJ: 0.3000 mg | INTRAMUSCULAR | 1 refills | Status: AC | PRN

## 2019-06-21 NOTE — Progress Notes (Signed)
49 y.o. G2P2 Married White or Caucasian female here for annual exam.  Doing well.  Denies vaginal bleeding.  Has Mirena IUD.  IUD inserted 04/09/16.  She reports she is having increased pelvic cramping and some increased breast tenderness.    Feels last year has been very stressful due to several other things that are unrelated to Covid.  Husband feels she is having a lot of anxiety.  She has been on citalopram for years.  We discussed increasing dosage to 30mg  to see how she feels.    PCP:  Dr. Reynaldo Minium.  Last visit was in 2020.  Blood work was good.    No LMP recorded. (Menstrual status: IUD).          Sexually active: Yes.    The current method of family planning is Mirena IUD inserted 04/09/16.    Exercising: Yes.    walking Smoker:  no  Health Maintenance: Pap:  01/14/17 Neg. HR HPV:neg              06/28/14 neg  History of abnormal Pap:  Yes, LGSIL MMG:  10/20/18 BIRADS 1 negative/density d Colonoscopy:  Stool tests were negative in the past BMD:   never TDaP:  2015 Screening Labs: PCP   reports that she has never smoked. She has never used smokeless tobacco. She reports current alcohol use of about 10.0 standard drinks of alcohol per week. She reports that she does not use drugs.  Past Medical History:  Diagnosis Date  . Abnormal Pap smear of cervix 2010  . Anaphylactic reaction    x 2   (hospitalized)  . Anxiety   . Depression   . Encounter for insertion of mirena IUD 03/2011, 03/2016  . Laceration of right little finger 10/2016  . Migraines   . STD (sexually transmitted disease)    HPV    Past Surgical History:  Procedure Laterality Date  . CESAREAN SECTION  2004 ,2006   x 2  . COLPOSCOPY  02/2009   LGSIL on Bx  . FINGER SURGERY Right 10/2016   Little finger    Current Outpatient Medications  Medication Sig Dispense Refill  . citalopram (CELEXA) 20 MG tablet Take 20 mg by mouth daily.  3  . EPIPEN 2-PAK 0.3 MG/0.3ML SOAJ injection See admin instructions.  6  .  levonorgestrel (MIRENA) 20 MCG/24HR IUD 1 each by Intrauterine route once.    . rizatriptan (MAXALT-MLT) 10 MG disintegrating tablet Take 1 tablet by mouth daily as needed.    . zolpidem (AMBIEN CR) 6.25 MG CR tablet      No current facility-administered medications for this visit.    Family History  Problem Relation Age of Onset  . Hypertension Brother   . Diabetes Brother        AODM  . Multiple sclerosis Mother   . Cancer Paternal Grandfather 42       dec    Review of Systems  Constitutional:       Night sweats  Psychiatric/Behavioral: The patient is nervous/anxious.   All other systems reviewed and are negative.   Exam:   BP 112/60 (BP Location: Right Arm, Patient Position: Sitting, Cuff Size: Normal)   Pulse 68   Temp 98.2 F (36.8 C) (Temporal)   Resp 10   Ht 5' 7.5" (1.715 m)   Wt 129 lb (58.5 kg)   BMI 19.91 kg/m   Height: 5' 7.5" (171.5 cm)  Ht Readings from Last 3 Encounters:  06/21/19 5' 7.5" (  1.715 m)  02/17/18 5\' 7"  (1.702 m)  10/18/17 5\' 7"  (1.702 m)    General appearance: alert, cooperative and appears stated age Head: Normocephalic, without obvious abnormality, atraumatic Neck: no adenopathy, supple, symmetrical, trachea midline and thyroid normal to inspection and palpation Lungs: clear to auscultation bilaterally Breasts: normal appearance, no masses or tenderness Heart: regular rate and rhythm Abdomen: soft, non-tender; bowel sounds normal; no masses,  no organomegaly Extremities: extremities normal, atraumatic, no cyanosis or edema Skin: Skin color, texture, turgor normal. No rashes or lesions Lymph nodes: Cervical, supraclavicular, and axillary nodes normal. No abnormal inguinal nodes palpated Neurologic: Grossly normal   Pelvic: External genitalia:  no lesions              Urethra:  normal appearing urethra with no masses, tenderness or lesions              Bartholins and Skenes: normal                 Vagina: normal appearing vagina with  normal color and discharge, no lesions              Cervix: no lesions, IUD string noted              Pap taken: No. Bimanual Exam:  Uterus:  normal size, contour, position, consistency, mobility, non-tender              Adnexa: normal adnexa and no mass, fullness, tenderness               Rectovaginal: Confirms               Anus:  normal sphincter tone, no lesions  Chaperone, Terence Lux, CMA, was present for exam.  A:  Well Woman with normal exam Social stressors and increased anxiety H/o LGILS pap 2010 Mirena IUD placed 12/17 H/o migraines H/o depression Hot flashes  P:   Mammogram guidelines reviewed.  Doing yearly pap smear with neg HR HPV 10/18, not indicated today Epi pen rx to pharmacy Estradiol and [place] obtained Will give pt options for therapists/SW for family stressors Referral to GI for screening colonoscopy Pt is going to try 30mg  Celexa, 1 1/2 tabs daily and will give update in a few weeks Return annually or prn

## 2019-06-22 DIAGNOSIS — Z8659 Personal history of other mental and behavioral disorders: Secondary | ICD-10-CM | POA: Insufficient documentation

## 2019-06-22 LAB — FOLLICLE STIMULATING HORMONE: FSH: 6.2 m[IU]/mL

## 2019-06-22 LAB — ESTRADIOL: Estradiol: 41.1 pg/mL

## 2019-06-28 ENCOUNTER — Telehealth: Payer: Self-pay | Admitting: Obstetrics & Gynecology

## 2019-06-28 ENCOUNTER — Encounter: Payer: Self-pay | Admitting: Obstetrics & Gynecology

## 2019-06-28 NOTE — Telephone Encounter (Signed)
Patient sent the following correspondence through [place].  I was seen for my annual exam one week ago (March 11th).  Dr. Sabra Heck and I discussed increasing my Citalopram prescription from one 20 MG tablet to one and a half tablets (30 MG).  I did this for the entire week until today (May 18) as it made me feel sleepy and sluggish.  I only took one (20 MG) tablet today hoping that I will feel energy return.  Also, I have not been contacted to schedule my colonoscopy and I do not see it on MyChart.  Do I need to do anything to get this done?  Thank you, Sandra Juarez [facility] [phone]

## 2019-06-28 NOTE — Telephone Encounter (Signed)
Spoke with patient.   1. Advised referral was placed to Dr. Collene Mares on 06/21/19. Our referral coordinator will f/u with appt details once appt scheduled. Patient agreeable.   2. Increased citalopram to 30 mg daily after OV on 06/21/19. Reports increased fatigue and feeling "sluggish" for the past wk, so she reduced back to 20 mg daily today. No change yet. Patient reports lower back ache and "upset stomach". Reports normal BMs, denies diarrhea, N/V, urinary symptoms. Patient received first Covid [place] vaccine on Sunday and has been playing more golf, so she thought these could also be the cause.   Advised patient to stay at 20 mg dose. I will review with Dr. Sabra Heck and f/u. Patient agreeable.

## 2019-07-02 NOTE — Telephone Encounter (Signed)
I think it's fine to stay at the 20mg  dosage.  She and her spouse are also considering therapy and I've given her a name for this as well.  Ok to close encounter.

## 2019-07-03 NOTE — Telephone Encounter (Signed)
Call to patient, left detailed message, ok per dpr, name identified on voicemail. Advised per Dr. Sabra Heck, return call to office if any additional questions or concerns.   Encounter closed.

## 2019-08-22 ENCOUNTER — Encounter: Payer: Self-pay | Admitting: Obstetrics & Gynecology

## 2019-09-06 ENCOUNTER — Other Ambulatory Visit: Payer: Self-pay | Admitting: Obstetrics & Gynecology

## 2019-09-06 DIAGNOSIS — Z1231 Encounter for screening mammogram for malignant neoplasm of breast: Secondary | ICD-10-CM

## 2019-10-23 ENCOUNTER — Other Ambulatory Visit: Payer: Self-pay

## 2019-10-23 ENCOUNTER — Ambulatory Visit
Admission: RE | Admit: 2019-10-23 | Discharge: 2019-10-23 | Disposition: A | Discharge status: Home / Self Care | Payer: Managed Care, Other (non HMO) | Source: Ambulatory Visit | Attending: Obstetrics & Gynecology | Admitting: Obstetrics & Gynecology

## 2019-10-23 DIAGNOSIS — Z1231 Encounter for screening mammogram for malignant neoplasm of breast: Secondary | ICD-10-CM

## 2019-11-05 IMAGING — MG DIGITAL SCREENING BILATERAL MAMMOGRAM WITH TOMO AND CAD
8 series · 9 of 24 positions shown · non-contrast
Comparison: Previous exam(s).

CLINICAL DATA: Screening.

EXAM:
DIGITAL SCREENING BILATERAL MAMMOGRAM WITH TOMO AND CAD

[R MLO synth-2D]
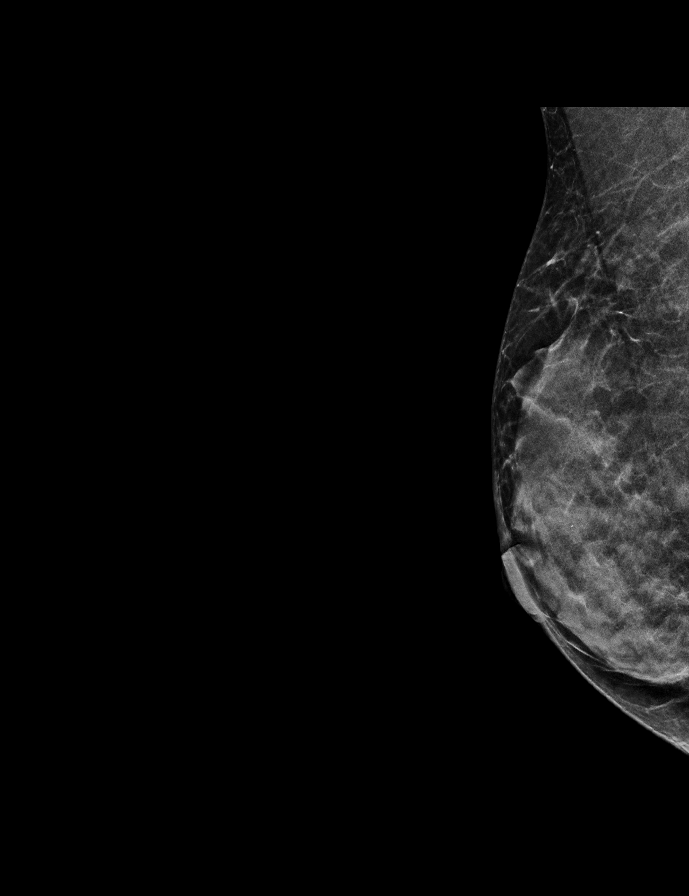

[L CC synth-2D]
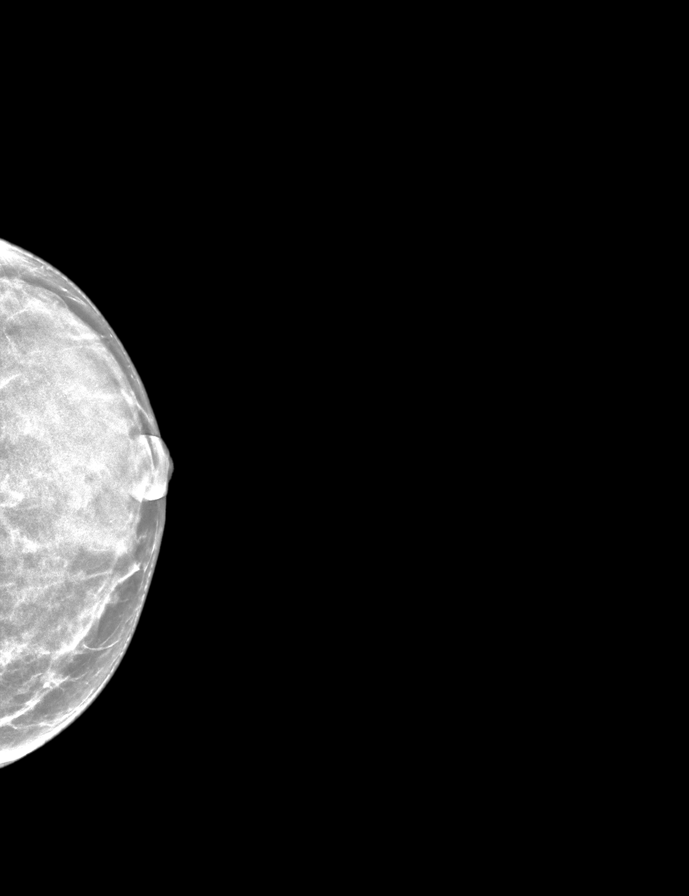

[L MLO synth-2D]
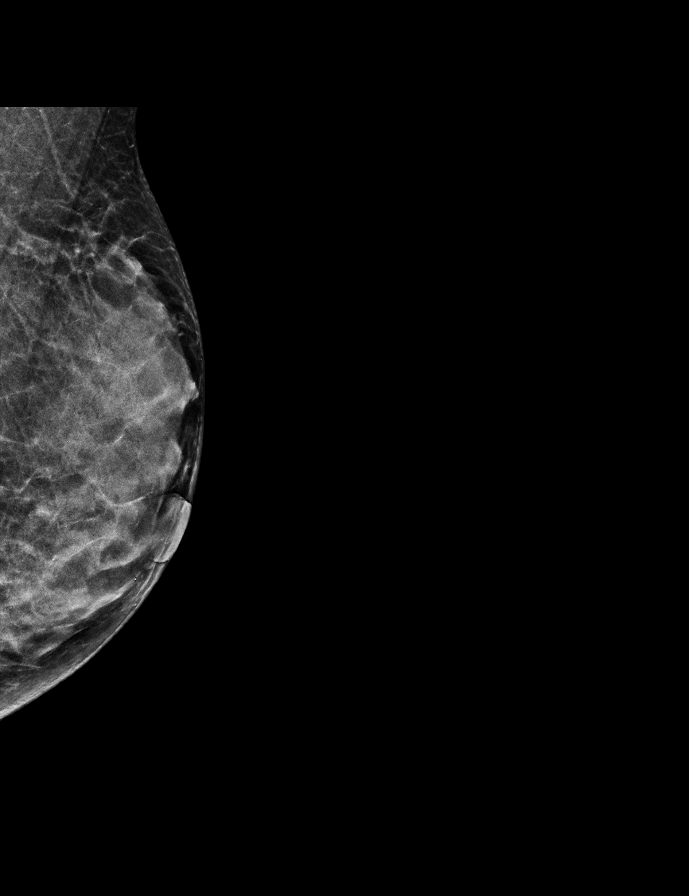

[R CC synth-2D]
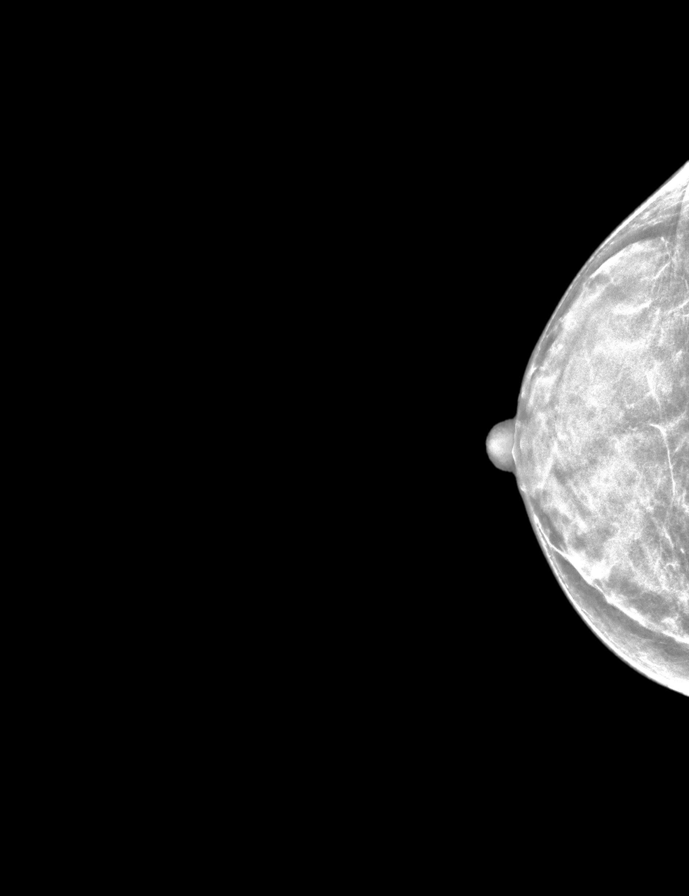

[R MLO tomo · 2 of 38 frames shown]
[frame 13/38]
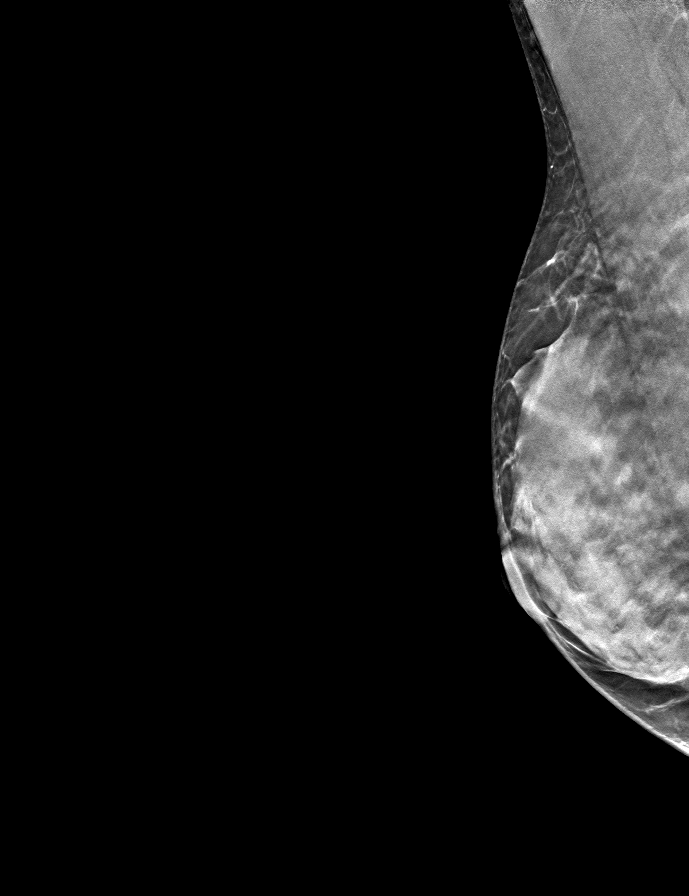
[frame 19/38]
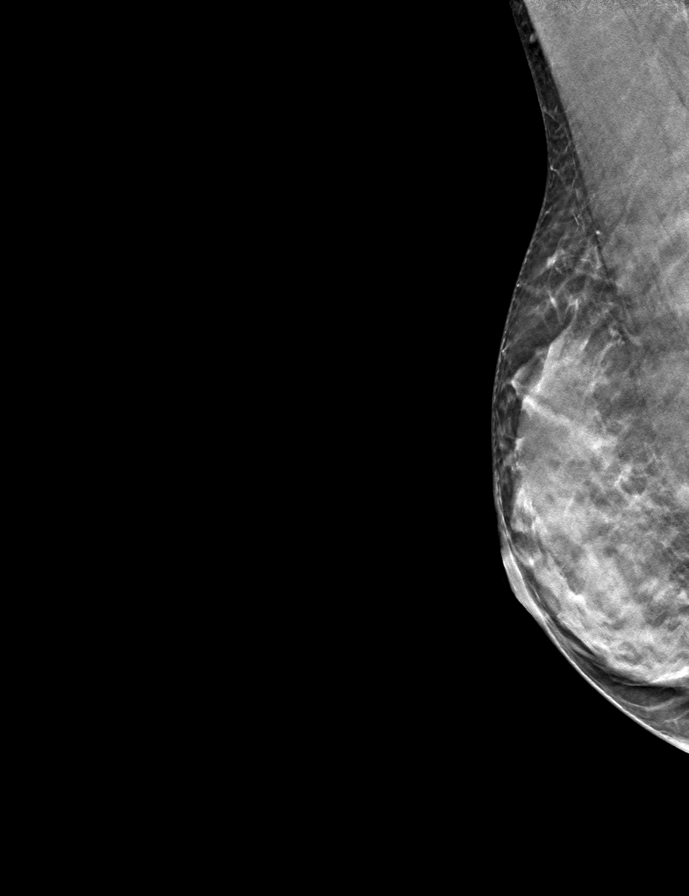

[L CC tomo · tomo slice 21/41.0]
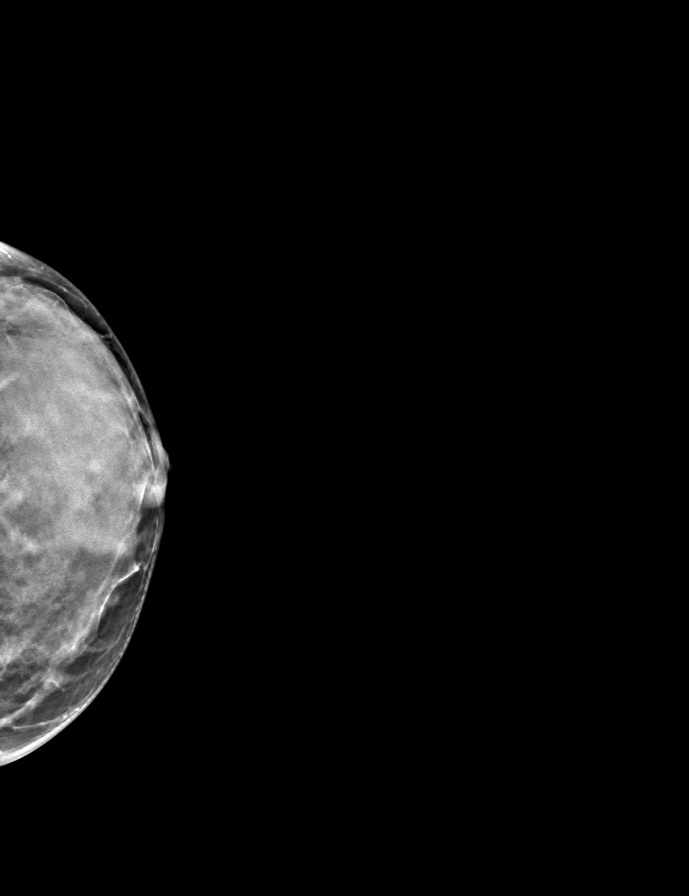

[R CC tomo · tomo slice 21/42.0]
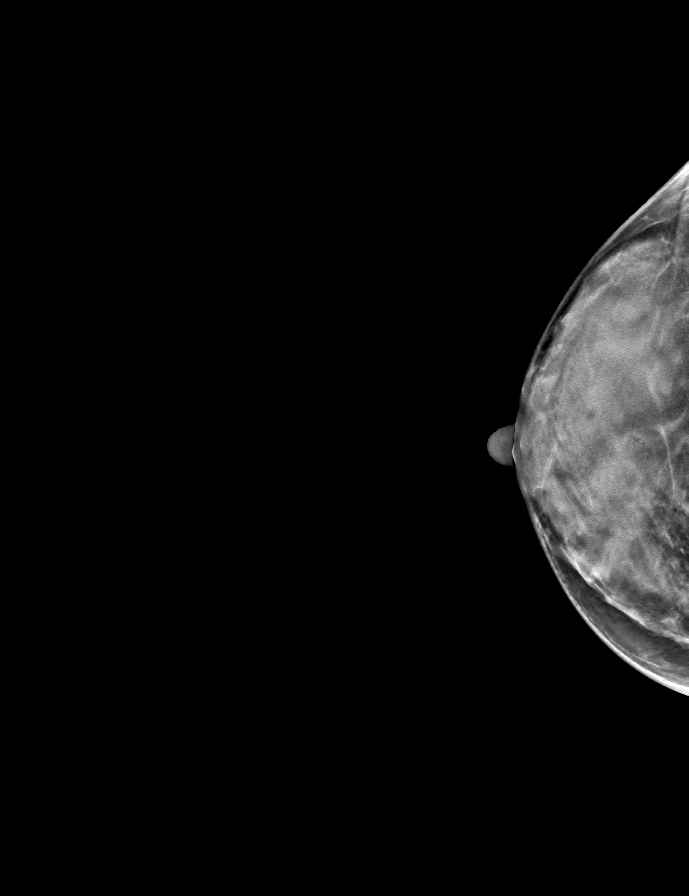

[L MLO tomo · tomo slice 21/40.0]
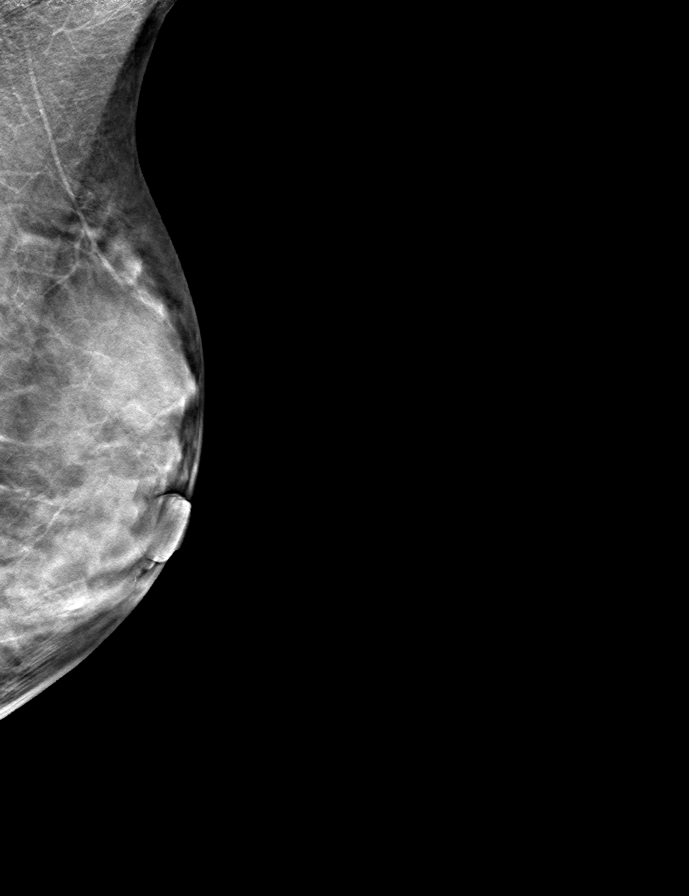

[9 of 24 positions shown; findings below may reference images not displayed]

ACR Breast Density Category d: The breast tissue is extremely dense,
which lowers the sensitivity of mammography
FINDINGS: There are no findings suspicious for malignancy. Images were
processed with CAD.
IMPRESSION: No mammographic evidence of malignancy. A result letter of this
screening mammogram will be mailed directly to the patient.

RECOMMENDATION:
Screening mammogram in one year. (Code:WO-0-ZI0)

BI-RADS CATEGORY  1: Negative.

## 2020-07-04 ENCOUNTER — Encounter (HOSPITAL_BASED_OUTPATIENT_CLINIC_OR_DEPARTMENT_OTHER): Payer: Self-pay | Admitting: Obstetrics & Gynecology

## 2020-07-04 ENCOUNTER — Other Ambulatory Visit (HOSPITAL_COMMUNITY)
Admission: RE | Admit: 2020-07-04 | Discharge: 2020-07-04 | Disposition: A | Discharge status: Home / Self Care | Payer: No Typology Code available for payment source | Source: Ambulatory Visit | Attending: Obstetrics & Gynecology | Admitting: Obstetrics & Gynecology

## 2020-07-04 ENCOUNTER — Other Ambulatory Visit: Payer: Self-pay

## 2020-07-04 ENCOUNTER — Ambulatory Visit (INDEPENDENT_AMBULATORY_CARE_PROVIDER_SITE_OTHER): Payer: No Typology Code available for payment source | Admitting: Obstetrics & Gynecology

## 2020-07-04 VITALS — BP 113/73 | HR 59 | Ht 67.0 in | Wt 132.0 lb

## 2020-07-04 DIAGNOSIS — Z975 Presence of (intrauterine) contraceptive device: Secondary | ICD-10-CM

## 2020-07-04 DIAGNOSIS — Z124 Encounter for screening for malignant neoplasm of cervix: Secondary | ICD-10-CM | POA: Diagnosis not present

## 2020-07-04 DIAGNOSIS — R87612 Low grade squamous intraepithelial lesion on cytologic smear of cervix (LGSIL): Secondary | ICD-10-CM

## 2020-07-04 DIAGNOSIS — Z01419 Encounter for gynecological examination (general) (routine) without abnormal findings: Secondary | ICD-10-CM | POA: Diagnosis not present

## 2020-07-04 DIAGNOSIS — G4709 Other insomnia: Secondary | ICD-10-CM

## 2020-07-04 DIAGNOSIS — Z8659 Personal history of other mental and behavioral disorders: Secondary | ICD-10-CM

## 2020-07-04 NOTE — Progress Notes (Signed)
50 y.o. G2P2 Married White or Caucasian female here for annual exam.  Pt has no complaints today.  Does not have vaginal bleeding.  IUD inserted 04/09/2016.  Feels like her body has changed some.  Feels she is getting closer to menopause.  7 year indication for IUD discussed.  No LMP recorded (lmp unknown). (Menstrual status: IUD).          Sexually active: Yes.    The current method of family planning is IUD.    Exercising: Yes.    walking Smoker:  no  Health Maintenance: Pap:  Obtained today History of abnormal Pap:  yes MMG:  10/2019 Colonoscopy:  05/2019, follow up 10 years BMD:   Not indicated TDaP:  2018 Pneumonia vaccine(s):  Not indicated Shingrix:   D/w pt today Hep C testing: discussed Screening Labs: done with PCP   reports that she has never smoked. She has never used smokeless tobacco. She reports current alcohol use of about 10.0 standard drinks of alcohol per week. She reports that she does not use drugs.  Past Medical History:  Diagnosis Date  . Abnormal Pap smear of cervix 2010  . Anaphylactic reaction    x 2   (hospitalized)  . Anxiety   . Depression   . Encounter for insertion of mirena IUD 03/2011, 03/2016  . Laceration of right little finger 10/2016  . Migraines   . STD (sexually transmitted disease)    HPV    Past Surgical History:  Procedure Laterality Date  . CESAREAN SECTION  2004 ,2006   x 2  . COLPOSCOPY  02/2009   LGSIL on Bx  . FINGER SURGERY Right 10/2016   Little finger    Current Outpatient Medications  Medication Sig Dispense Refill  . citalopram (CELEXA) 20 MG tablet Take 20 mg by mouth daily.  3  . EPINEPHrine 0.3 mg/0.3 mL IJ SOAJ injection Inject 0.3 mLs (0.3 mg total) into the muscle as needed for anaphylaxis. 1 each 1  . gabapentin (NEURONTIN) 300 MG capsule gabapentin 300 mg capsule  TAKE 1 CAPSULE BY MOUTH THREE TIMES A DAY    . levonorgestrel (MIRENA) 20 MCG/24HR IUD 1 each by Intrauterine route once.    . rizatriptan  (MAXALT-MLT) 10 MG disintegrating tablet Take 1 tablet by mouth daily as needed.    . zolpidem (AMBIEN CR) 6.25 MG CR tablet     . zolpidem (AMBIEN) 5 MG tablet zolpidem 5 mg tablet     No current facility-administered medications for this visit.    Family History  Problem Relation Age of Onset  . Hypertension Brother   . Diabetes Brother        AODM  . Multiple sclerosis Mother   . Cancer Paternal Grandfather 73       dec    Review of Systems  All other systems reviewed and are negative.   Exam:   BP 113/73 (BP Location: Right Arm, Patient Position: Sitting, Cuff Size: Normal)   Pulse (!) 59   Ht 5\' 7"  (1.702 m)   Wt 132 lb (59.9 kg)   LMP  (LMP Unknown)   SpO2 100%   BMI 20.67 kg/m   Height: 5\' 7"  (170.2 cm)  General appearance: alert, cooperative and appears stated age Head: Normocephalic, without obvious abnormality, atraumatic Neck: no adenopathy, supple, symmetrical, trachea midline and thyroid normal to inspection and palpation Lungs: clear to auscultation bilaterally Breasts: normal appearance, no masses or tenderness Heart: regular rate and rhythm Abdomen: soft, non-tender;  bowel sounds normal; no masses,  no organomegaly Extremities: extremities normal, atraumatic, no cyanosis or edema Skin: Skin color, texture, turgor normal. No rashes or lesions Lymph nodes: Cervical, supraclavicular, and axillary nodes normal. No abnormal inguinal nodes palpated Neurologic: Grossly normal   Pelvic: External genitalia:  no lesions              Urethra:  normal appearing urethra with no masses, tenderness or lesions              Bartholins and Skenes: normal                 Vagina: normal appearing vagina with normal color and discharge, no lesions              Cervix: no lesions, IUD string noted              Pap taken: Yes.   Bimanual Exam:  Uterus:  normal size, contour, position, consistency, mobility, non-tender              Adnexa: normal adnexa and no mass,  fullness, tenderness               Rectovaginal: Confirms               Anus:  normal sphincter tone, no lesions  Chaperone, Cydney Ok, RN, was present for exam.  Assessment/Plan: 1. Well woman exam with routine gynecological exam - pap smear with HR HPV obtained today - MMG 10/2019 - colonoscopy 2021 - lab work done with Dr. Reynaldo Minium - vaccines UTD  2. IUD (intrauterine device) in place (placed 2017) - newer 7 year indication discussed.  Will plan [facility] prior to removal in 2024  3. Other insomnia - Ambulatory referral to Neurology - pt would like to try and be off ambien, prescribed by PCP  4. LGSIL on Pap smear of cervix  5. History of depression - on Celexa

## 2020-07-07 LAB — CYTOLOGY - PAP
Comment: NEGATIVE
Diagnosis: NEGATIVE
High risk HPV: NEGATIVE

## 2020-09-03 ENCOUNTER — Ambulatory Visit (INDEPENDENT_AMBULATORY_CARE_PROVIDER_SITE_OTHER): Payer: No Typology Code available for payment source | Admitting: Neurology

## 2020-09-03 ENCOUNTER — Encounter: Payer: Self-pay | Admitting: Neurology

## 2020-09-03 VITALS — BP 106/69 | HR 50 | Ht 67.0 in | Wt 133.0 lb

## 2020-09-03 DIAGNOSIS — G4719 Other hypersomnia: Secondary | ICD-10-CM | POA: Diagnosis not present

## 2020-09-03 DIAGNOSIS — F5104 Psychophysiologic insomnia: Secondary | ICD-10-CM

## 2020-09-03 DIAGNOSIS — G43009 Migraine without aura, not intractable, without status migrainosus: Secondary | ICD-10-CM

## 2020-09-03 DIAGNOSIS — M50122 Cervical disc disorder at C5-C6 level with radiculopathy: Secondary | ICD-10-CM | POA: Diagnosis not present

## 2020-09-03 MED ORDER — TRAZODONE HCL 50 MG PO TABS: 25.0000 mg | ORAL_TABLET | Freq: Every evening | ORAL | 2 refills | Status: DC | PRN

## 2020-09-03 NOTE — Progress Notes (Signed)
SLEEP MEDICINE CLINIC    Provider:  Larey Seat, MD  Primary Care Physician:  Burnard Bunting, MD [place] [id number]     Referring Provider: Hale Bogus, MD Gynecology.         Chief Complaint according to patient   Patient presents with:    . New Patient (Initial Visit)     Rm 11 alone- here for sleep consult. Pt reports she has been on Zolpidem for a number of years and cannot sleep unless she takes it. She sts she has tried several times to come off the medication but will be up for several days. Pt is worried about the long term effects of being on this med. Wanted to discuss other options.       HISTORY OF PRESENT ILLNESS:  Sandra Juarez is a 50 y.o. White or Caucasian female patient and is seen here on 09-03-2020 upon referral by Hale Bogus, MD Gynecology- on 09/03/2020.  Chief concern according to patient :  I take 5 mg generic Ambien each night to sleep, for about 15 years. And I have a mirena for 15 years, my migraines have gotten better but cannot sleep unless she takes it. She sts she has tried several times to come off the medication but will be up for several days.  Pt is worried about the long term effects of being on this med. Wanted to discuss other options.     Rhilynn Preyer  has a past medical history of Abnormal Pap smear of cervix (2010), Anaphylactic reaction, Anxiety, Depression, Encounter for insertion of mirena IUD (03/2011, 03/2016), Laceration of right little finger (10/2016), Migraines, and vaginal yeast infection.   Sleep relevant medical history: used to have morning headache- and these evolved over the day to bad migraine-nausea, photophobia, phonophobia, hightened sense of smell.   cervical spine injury- radiculopathy, left arm tingling.     Family medical /sleep history:  other family member on CPAP with OSA, insomnia, sleep walkers. mother with MS and PD(!).    Social history:  Patient is a Agricultural engineer, has 2  boys, teenagers [name] and  [name] (  with learning disability- by degree AUDIOLOGIST, worked for [employer] cochlear implant and lives in a household with 4 persons . Lot of volunteer activity. Was home schooling, now no longer.  somewhat isolating.  Pets are present. Dog named Barkely. Tobacco use; never.  ETOH use: 2-3 glasses of wine 3-4 days a week.    Caffeine intake in form of Coffee( 1-2 cups in AM )  Regular exercise in form of walking, pilates.      Sleep habits are as follows: The patient's dinner time is between 6 PM. The patient goes to bed at 9.30 PM and continues to sleep for 3-4 hours, wakes once for a bathroom break- can go back to sleep when on  AMBIEN.   The preferred sleep position is on the right side,  with the support of multiple pillows.  Dreams are reportedly newly frequent/vivid.  6.15 AM is the usual rise time.  The patient wakes up spontaneously.  She reports  feeling refreshed or restored in AM, with symptoms such as feeling more tired- Naps are taken frequently, lasting from 3-4 PM. 60 minutes and are refreshing..    Review of Systems: Out of a complete 14 system review, the patient complains of only the following symptoms, and all other reviewed systems are negative.:  Fatigue, sleepiness - no snoring , without  ambiene she has fragmented sleep,    How likely are you to doze in the following situations: 0 = not likely, 1 = slight chance, 2 = moderate chance, 3 = high chance   Sitting and Reading? Watching Television? Sitting inactive in a public place (theater or meeting)? As a passenger in a car for an hour without a break? Lying down in the afternoon when circumstances permit? Sitting and talking to someone? Sitting quietly after lunch without alcohol? In a car, while stopped for a few minutes in traffic?   Total = 10/ 24 points   FSS endorsed at 36/ 63 points.   Social History   Socioeconomic History  . Marital status: Married    Spouse name: Anaelle Dunton  . Number of children: 2  . Years of education: Masters  . Highest education level: Not on file  Occupational History  . Not on file  Tobacco Use  . Smoking status: Never Smoker  . Smokeless tobacco: Never Used  Vaping Use  . Vaping Use: Never used  Substance and Sexual Activity  . Alcohol use: Yes    Alcohol/week: 10.0 standard drinks    Types: 10 Standard drinks or equivalent per week  . Drug use: No  . Sexual activity: Yes    Partners: Male    Birth control/protection: I.U.D.    Comment: Mirena--inserted 04/09/16  Other Topics Concern  . Not on file  Social History Narrative   Lives at home with husband and 2 children   Caffeine use: occasionally (2-3/week, includes coffee, diet coke)    Social Determinants of Health   Financial Resource Strain: Not on file  Food Insecurity: Not on file  Transportation Needs: Not on file  Physical Activity: Not on file  Stress: Not on file  Social Connections: Not on file    Family History  Problem Relation Age of Onset  . Hypertension Brother   . Diabetes Brother        AODM  . Multiple sclerosis Mother   . Cancer Paternal Grandfather 63       dec    Past Medical History:  Diagnosis Date  . Abnormal Pap smear of cervix 2010  . Anaphylactic reaction    x 2   (hospitalized)  . Anxiety   . Depression   . Encounter for insertion of mirena IUD 03/2011, 03/2016  . Laceration of right little finger 10/2016  . Migraines   . STD (sexually transmitted disease)    HPV    Past Surgical History:  Procedure Laterality Date  . CESAREAN SECTION  2004 ,2006   x 2  . COLPOSCOPY  02/2009   LGSIL on Bx  . FINGER SURGERY Right 10/2016   Little finger     Current Outpatient Medications on File Prior to Visit  Medication Sig Dispense Refill  . citalopram (CELEXA) 20 MG tablet Take 20 mg by mouth daily.  3  . EPINEPHrine 0.3 mg/0.3 mL IJ SOAJ injection Inject 0.3 mLs (0.3 mg total) into the muscle as needed for  anaphylaxis. 1 each 1  . levonorgestrel (MIRENA) 20 MCG/24HR IUD 1 each by Intrauterine route once.    . rizatriptan (MAXALT-MLT) 10 MG disintegrating tablet Take 1 tablet by mouth daily as needed.    . zolpidem (AMBIEN) 10 MG tablet Take 5-10 mg by mouth at bedtime as needed.     No current facility-administered medications on file prior to visit.    Allergies  Allergen Reactions  . Lidocaine  Physical exam:  Today's Vitals   09/03/20 1015  BP: 106/69  Pulse: (!) 50  Weight: 133 lb (60.3 kg)  Height: 5\' 7"  (1.702 m)   Body mass index is 20.83 kg/m.   Wt Readings from Last 3 Encounters:  09/03/20 133 lb (60.3 kg)  07/04/20 132 lb (59.9 kg)  06/21/19 129 lb (58.5 kg)     Ht Readings from Last 3 Encounters:  09/03/20 5\' 7"  (1.702 m)  07/04/20 5\' 7"  (1.702 m)  06/21/19 5' 7.5" (1.715 m)      General: The patient is awake, alert and appears not in acute distress. The patient is well groomed. Head: Normocephalic, atraumatic.  Neck is supple. Mallampati 1,  neck circumference: 13 inches .  Nasal airflow patent.  Retrognathia is not seen.  Dental status: biological , wears a bite guard. Bruxism.  Cardiovascular:  Regular rate and cardiac rhythm by pulse,  without distended neck veins. Respiratory: Lungs are clear to auscultation.  Skin:  Without evidence of ankle edema, or rash. Trunk: The patient's posture is erect.   Neurologic exam : The patient is awake and alert, oriented to place and time.   Memory subjective described as intact.  Attention span & concentration ability appears normal.  Speech is fluent,  without  dysarthria, dysphonia or aphasia.  Mood and affect are appropriate.   Cranial nerves: no loss of smell or taste reported  Pupils are equal and briskly reactive to light. Funduscopic exam deferred. .  Extraocular movements in vertical and horizontal planes were intact and without nystagmus. No Diplopia. Visual fields by finger perimetry are  intact. Hearing was intact to soft voice and finger rubbing.    Facial sensation intact to fine touch.  Facial motor strength is symmetric and tongue and uvula move midline.  Neck ROM : rotation, tilt and flexion extension were normal for age and shoulder shrug was symmetrical.    Motor exam:  Symmetric bulk, tone and ROM.   Normal tone without cog wheeling, symmetric grip strength .   Sensory:  tingling the left upper extremity, right handed. C5 - C 6 radiculopathy.  Proprioception tested in the upper extremities was normal.   Coordination: Rapid alternating movements in the fingers/hands were of normal speed.  The Finger-to-nose maneuver was intact without evidence of ataxia, dysmetria or tremor.   Gait and station: Patient could rise unassisted from a seated position, walked without assistive device.  Stance is of normal width/ base and the patient turned with 3 steps.  Toe and heel walk were deferred.  Deep tendon reflexes: in the upper and lower extremities are symmetric and intact.  Babinski response was deferred.       After spending a total time of  35 minutes face to face and additional time for physical and neurologic examination, review of laboratory studies,  personal review of imaging studies, reports and results of other testing and review of referral information / records as far as provided in visit, I have established the following assessments:  1)  EDS/ fatigue  2) bruxism  3) chronic insomnia.    My Plan is to proceed with:  1) I will ask for HST to rule out OSA, then follow with insomnia treatments.  [employer] camp for insomnia.  The patient already has set a rise time in the morning which is the first step to successful insomnia treatment.  We will address to reduce alcohol to 1 glass with dinner and not after dinner or before bedtime as  it often interrupts sleep within the third or fourth hour of sleep.  In addition I like for her to take a hot shower or bath before  bedtime to help with relaxation and also to use the "temperature change as a sleep aid.  The bedroom should be quiet cool and dark, clots should be turned around so that she is not counting down her remaining hours of sleep.  This usually creates more anxiety and a sense of failure when sleep cannot be initiated.  Screen time to be reduced by 1 hour before intended bedtime.  If possible I like the patient's to read in a magazine or book this pages not on a device. In order to help her bridge from Ambien I would suggest to try trazodone which is not habit-forming, usually it creates neither a dry mouth nor a hangover effect and can be used at 25, 5400 mg to be taken at intended bedtime.  It may still take 2 or 3 weeks before Ambien can be replaced and the first step is to start with 25 mg all rounds of trazodone at bedtime and reduce Ambien to every other day once trazodone has felt to have an effect.   I would like to thank  Burnard Bunting, [place],  [place] [id number] for allowing me to meet with and to take care of this pleasant patient.   In Juarez, Sandra Juarez is presenting with chronic insomnia.    I plan to follow up either personally or through our NP within 2-3 month.     Electronically signed by: Larey Seat, MD 09/03/2020 10:59 AM  [facility] Board certified by The [employer] of Sleep Medicine and Diplomate of the [employer] of Sleep Medicine. Board certified In Neurology through the [employer], Fellow of the [employer] of Neurology. Medical Director of [facility].

## 2020-09-03 NOTE — Patient Instructions (Addendum)
Insomnia Insomnia is a sleep disorder that makes it difficult to fall asleep or stay asleep. Insomnia can cause fatigue, low energy, difficulty concentrating, mood swings, and poor performance at work or school. There are three different ways to classify insomnia:  Difficulty falling asleep.  Difficulty staying asleep.  Waking up too early in the morning. Any type of insomnia can be long-term (chronic) or short-term (acute). Both are common. Short-term insomnia usually lasts for three months or less. Chronic insomnia occurs at least three times a week for longer than three months. What are the causes? Insomnia may be caused by another condition, situation, or substance, such as:  Anxiety.  Certain medicines.  Gastroesophageal reflux disease (GERD) or other gastrointestinal conditions.  Asthma or other breathing conditions.  Restless legs syndrome, sleep apnea, or other sleep disorders.  Chronic pain.  Menopause.  Stroke.  Abuse of alcohol, tobacco, or illegal drugs.  Mental health conditions, such as depression.  Caffeine.  Neurological disorders, such as Alzheimer's disease.  An overactive thyroid (hyperthyroidism). Sometimes, the cause of insomnia may not be known. What increases the risk? Risk factors for insomnia include:  Gender. Women are affected more often than men.  Age. Insomnia is more common as you get older.  Stress.  Lack of exercise.  Irregular work schedule or working night shifts.  Traveling between different time zones.  Certain medical and mental health conditions. What are the signs or symptoms? If you have insomnia, the main symptom is having trouble falling asleep or having trouble staying asleep. This may lead to other symptoms, such as:  Feeling fatigued or having low energy.  Feeling nervous about going to sleep.  Not feeling rested in the morning.  Having trouble concentrating.  Feeling irritable, anxious, or depressed. How  is this diagnosed? This condition may be diagnosed based on:  Your symptoms and medical history. Your health care provider may ask about: ? Your sleep habits. ? Any medical conditions you have. ? Your mental health.  A physical exam. How is this treated? Treatment for insomnia depends on the cause. Treatment may focus on treating an underlying condition that is causing insomnia. Treatment may also include:  Medicines to help you sleep.  Counseling or therapy.  Lifestyle adjustments to help you sleep better. Follow these instructions at home: Eating and drinking  Limit or avoid alcohol, caffeinated beverages, and cigarettes, especially close to bedtime. These can disrupt your sleep.  Do not eat a large meal or eat spicy foods right before bedtime. This can lead to digestive discomfort that can make it hard for you to sleep.   Sleep habits  Keep a sleep diary to help you and your health care provider figure out what could be causing your insomnia. Write down: ? When you sleep. ? When you wake up during the night. ? How well you sleep. ? How rested you feel the next day. ? Any side effects of medicines you are taking. ? What you eat and drink.  Make your bedroom a dark, comfortable place where it is easy to fall asleep. ? Put up shades or blackout curtains to block light from outside. ? Use a white noise machine to block noise. ? Keep the temperature cool.  Limit screen use before bedtime. This includes: ? Watching TV. ? Using your smartphone, tablet, or computer.  Stick to a routine that includes going to bed and waking up at the same times every day and night. This can help you fall asleep faster. Consider   making a quiet activity, such as reading, part of your nighttime routine.  Try to avoid taking naps during the day so that you sleep better at night.  Get out of bed if you are still awake after 15 minutes of trying to sleep. Keep the lights down, but try reading or  doing a quiet activity. When you feel sleepy, go back to bed.   General instructions  Take over-the-counter and prescription medicines only as told by your health care provider.  Exercise regularly, as told by your health care provider. Avoid exercise starting several hours before bedtime.  Use relaxation techniques to manage stress. Ask your health care provider to suggest some techniques that may work well for you. These may include: ? Breathing exercises. ? Routines to release muscle tension. ? Visualizing peaceful scenes.  Make sure that you drive carefully. Avoid driving if you feel very sleepy.  Keep all follow-up visits as told by your health care provider. This is important. Contact a health care provider if:  You are tired throughout the day.  You have trouble in your daily routine due to sleepiness.  You continue to have sleep problems, or your sleep problems get worse. Get help right away if:  You have serious thoughts about hurting yourself or someone else. If you ever feel like you may hurt yourself or others, or have thoughts about taking your own life, get help right away. You can go to your nearest emergency department or call:  Your local emergency services (911 in the [place]  A suicide crisis helpline, such as the [facility] at 256-674-9972. This is open 24 hours a day. Summary  Insomnia is a sleep disorder that makes it difficult to fall asleep or stay asleep.  Insomnia can be long-term (chronic) or short-term (acute).  Treatment for insomnia depends on the cause. Treatment may focus on treating an underlying condition that is causing insomnia.  Keep a sleep diary to help you and your health care provider figure out what could be causing your insomnia. This information is not intended to replace advice given to you by your health care provider. Make sure you discuss any questions you have with your health care provider. Document  Revised: 02/07/2020 Document Reviewed: 02/07/2020 Elsevier Patient Education  2021 [employer].  [place] camp for insomnia.  The patient already has set a rise time in the morning which is the first step to successful insomnia treatment.  We will address to reduce alcohol to 1 glass with dinner and not after dinner or before bedtime as it often interrupts sleep within the third or fourth hour of sleep.  In addition I like for her to take a hot shower or bath before bedtime to help with relaxation and also to use the "temperature change as a sleep aid.  The bedroom should be quiet cool and dark, clots should be turned around so that she is not counting down her remaining hours of sleep.  This usually creates more anxiety and a sense of failure when sleep cannot be initiated.  Screen time to be reduced by 1 hour before intended bedtime.  If possible I like the patient's to read in a magazine or book this pages not on a device. In order to help her bridge from Ambien I would suggest to try trazodone which is not habit-forming, usually it creates neither a dry mouth nor a hangover effect and can be used at 25, 5400 mg to be taken at intended bedtime.  It may still take 2 or 3 weeks before Ambien can be replaced and the first step is to start with 25 mg all rounds of trazodone at bedtime and reduce Ambien to every other day once trazodone has felt to have an effect.

## 2020-09-19 ENCOUNTER — Other Ambulatory Visit: Payer: Self-pay | Admitting: Obstetrics & Gynecology

## 2020-09-19 DIAGNOSIS — Z1231 Encounter for screening mammogram for malignant neoplasm of breast: Secondary | ICD-10-CM

## 2020-09-22 ENCOUNTER — Telehealth: Payer: Self-pay | Admitting: Neurology

## 2020-09-22 NOTE — Telephone Encounter (Signed)
Pt states she is feeling ill since she started the traZODone (DESYREL) 50 MG tablet and she would like to discuss with RN. Please advise.

## 2020-09-22 NOTE — Telephone Encounter (Signed)
Called the pt back to get more information. Pt started the weaning process with her medication on May 28,2022. She previously has been on Ambien 10 mg tablet for 15 yrs and was wanting to get off the medication. During the weaning off process she was advised to take 5 mg ambien one night and trazodone 25 mg the next night and alternate. On June 4th, they were traveling home from a vacation and she developed motion sickness (or what she thought it was) She has continued to have this motion sickness feeling, nausea and upset stomach. She has to lay down and is weak.  She is unsure if this was due to starting trazodone a new medication and possibly side effects from that or if it was side effects from weaning off the [place]. She stopped the trazodone 2 days ago and just took [place] 5 mg that night and next night, she doesn't feel any different having not taken the trazodone.   I advised I will inform Dr Brett Fairy of this and reach back out with her recommendations or thoughts on this. Pt verbalized understanding.

## 2020-09-22 NOTE — Telephone Encounter (Signed)
Called the patient back.  Advised that Dr. Brett Fairy recommends to hold off on alternating the medications for now.  Patient should continue with the Ambien 5 mg at bedtime for 7 days.  After that she can start the trazodone 25 mg at bedtime while stopping the Ambien. Encouraged the patient that if the symptoms continue to worsen she may want to discuss with her primary care to rule out if there is any other medical concerns.Pt verbalized understanding. Advised the patient to keep [place] updated if there are any other concerns or issues.

## 2020-09-25 ENCOUNTER — Ambulatory Visit: Payer: 59

## 2020-10-06 ENCOUNTER — Ambulatory Visit (INDEPENDENT_AMBULATORY_CARE_PROVIDER_SITE_OTHER): Payer: No Typology Code available for payment source | Admitting: Neurology

## 2020-10-06 DIAGNOSIS — M50122 Cervical disc disorder at C5-C6 level with radiculopathy: Secondary | ICD-10-CM

## 2020-10-06 DIAGNOSIS — G4733 Obstructive sleep apnea (adult) (pediatric): Secondary | ICD-10-CM

## 2020-10-06 DIAGNOSIS — F5104 Psychophysiologic insomnia: Secondary | ICD-10-CM

## 2020-10-06 DIAGNOSIS — G4719 Other hypersomnia: Secondary | ICD-10-CM

## 2020-10-08 NOTE — Progress Notes (Signed)
   [facility] (Watch PAT) REPORT  STUDY DATE: 10-06-20  DOB: [birth year]  MRN: [id number]  ORDERING CLINICIAN: Larey Seat, MD   REFERRING CLINICIAN: Hale Bogus, MD  CLINICAL INFORMATION/HISTORY: Sandra Juarez  has a medical history of Abnormal Pap smear of cervix (2010), Anaphylactic reaction, Anxiety, Depression, Encounter for insertion of mirena IUD (03/2011, 03/2016), Laceration of right little finger (10/2016), Migraines, and chronic Insomnia.  Epworth sleepiness score: 10/24. BMI: 20.8 kg/m Neck Circumference: 13"  Sleep Summary:   Total Recording Time (hours, min): 8 h 22 min Total Sleep Time (hours, min):  7 h 53 min   Percent REM (%):    30.19%   Respiratory Indices:   Calculated pAHI (per hour):  4.6/hour         REM pAHI:       6.3/hour       NREM pAHI:   3.8/hour   Oxygen Saturation Statistics:    Oxygen Saturation (%) Mean: 95%   Minimum oxygen saturation (%):       88%   O2 Saturation Range (%):   88 - 99%    O2 Saturation (minutes) <=88%: 0 min  Pulse Rate Statistics:   Pulse Mean (bpm):  95    Pulse Range was 44 -79 bpm.  IMPRESSION: There is only very mild OSA (obstructive sleep apnea) present, no hypoxia, abnormal heart rate or reduced sleep time was noted. The patient's AHI is lower than 5 and that degree of sleep apnea is considered physiological.   RECOMMENDATION: The patient reached over 7 hours of sleep time, no snoring and no clinically relevant degree of apnea was found.    INTERPRETING PHYSICIAN:  Larey Seat, MD    [facility] and [facility] Sleep Board certified by The [employer] of Sleep Medicine and Diplomate of the [employer] of Sleep Medicine. Board certified In Neurology through the [place], Fellow of the [employer] of Neurology. Medical Director of [employer].

## 2020-10-21 ENCOUNTER — Encounter: Payer: Self-pay | Admitting: Neurology

## 2020-10-21 ENCOUNTER — Telehealth: Payer: Self-pay | Admitting: Neurology

## 2020-10-21 NOTE — Progress Notes (Signed)
IMPRESSION: There is only very mild OSA (obstructive sleep apnea) present, no hypoxia, abnormal heart rate or reduced sleep time was noted. The patient's AHI is lower than 5 and that degree of sleep apnea is considered physiological.  RECOMMENDATION: The patient reached over 7 hours of sleep time, no snoring and no clinically relevant degree of apnea was found.   INTERPRETING PHYSICIAN: Larey Seat, MD

## 2020-10-21 NOTE — Telephone Encounter (Signed)
-----   Message from Larey Seat, MD sent at 10/21/2020  1:01 PM EDT ----- IMPRESSION: There is only very mild OSA (obstructive sleep apnea) present, no hypoxia, abnormal heart rate or reduced sleep time was noted. The patient's AHI is lower than 5 and that degree of sleep apnea is considered physiological.  RECOMMENDATION: The patient reached over 7 hours of sleep time, no snoring and no clinically relevant degree of apnea was found.   INTERPRETING PHYSICIAN: Larey Seat, MD

## 2020-10-21 NOTE — Procedures (Signed)
[facility] (Watch PAT) REPORT  STUDY DATE: 10-06-20  DOB: [birth year]  MRN: [id number]  ORDERING CLINICIAN: Larey Seat, MD   REFERRING CLINICIAN: Hale Bogus, MD  CLINICAL INFORMATION/HISTORY: Sandra Juarez  has a medical history of Abnormal Pap smear of cervix (2010), Anaphylactic reaction, Anxiety, Depression, Encounter for insertion of mirena IUD (03/2011, 03/2016), Laceration of right little finger (10/2016), Migraines, and chronic Insomnia.  Epworth sleepiness score: 10/24. BMI: 20.8 kg/m Neck Circumference: 13"  Sleep Summary:   Total Recording Time (hours, min): 8 h 22 min Total Sleep Time (hours, min):  7 h 53 min   Percent REM (%):    30.19%   Respiratory Indices:   Calculated pAHI (per hour):  4.6/hour         REM pAHI:       6.3/hour       NREM pAHI:   3.8/hour   Oxygen Saturation Statistics:    Oxygen Saturation (%) Mean: 95%   Minimum oxygen saturation (%):       88%   O2 Saturation Range (%):   88 - 99%    O2 Saturation (minutes) <=88%: 0 min  Pulse Rate Statistics:   Pulse Mean (bpm):  95    Pulse Range was 44 -79 bpm.  IMPRESSION: There is only very mild OSA (obstructive sleep apnea) present, no hypoxia, abnormal heart rate or reduced sleep time was noted. The patient's AHI is lower than 5 and that degree of sleep apnea is considered physiological.   RECOMMENDATION: The patient reached over 7 hours of sleep time, no snoring and no clinically relevant degree of apnea was found.    INTERPRETING PHYSICIAN:  Larey Seat, MD    [facility] and [facility] Sleep Board certified by The [employer] of Sleep Medicine and Diplomate of the [employer] of Sleep Medicine. Board certified In Neurology through the [place], Fellow of the [employer] of Neurology. Medical Director of [employer].

## 2020-10-21 NOTE — Telephone Encounter (Signed)
Called patient to discuss sleep study results. No answer at this time. LVM for the patient to call back.  Will send a mychart message as well. 

## 2020-10-26 ENCOUNTER — Other Ambulatory Visit: Payer: Self-pay | Admitting: Neurology

## 2020-11-14 ENCOUNTER — Ambulatory Visit
Admission: RE | Admit: 2020-11-14 | Discharge: 2020-11-14 | Disposition: A | Discharge status: Home / Self Care | Payer: No Typology Code available for payment source | Source: Ambulatory Visit | Attending: Obstetrics & Gynecology | Admitting: Obstetrics & Gynecology

## 2020-11-14 ENCOUNTER — Other Ambulatory Visit: Payer: Self-pay

## 2020-11-14 DIAGNOSIS — Z1231 Encounter for screening mammogram for malignant neoplasm of breast: Secondary | ICD-10-CM

## 2021-03-15 NOTE — Progress Notes (Signed)
[facility]    Provider:  Dr Jaynee Eagles Requesting Provider: Melina Schools, MD Primary Care Provider:  Burnard Bunting, MD  CC:  tingling and numbness laft side neck down arm and when she holds her head a different way she gets blurry  HPI:  Sandra Juarez is a 50 y.o. female here as requested by Sandra Schools, MD for visual field changes. PMHx migraines, cervical disk disorder at c5-c6 with radiculopathy, depression.  She has a Mirena IUD.  Patient is referred by Sandra Juarez.  She has seen my colleague Dr. Brett Juarez in the past and had a sleep test, I reviewed sleep report and sleep data, it showed very mild obstructive sleep apnea, no hypoxia, normal rate and rhythm of her heart, no reduced sleep time, the patient's AHI was less than 5 considered in the normal range, she had no snoring and she slept for over 7 hours.  She initially saw my colleague Dr. Brett Juarez in May of this year for a sleep consult, reports she has been on Ambien for a number of years he cannot sleep unless she takes it, she had tried several times to come off the medication unsuccessfully, Dr. Edwena Juarez examination was normal.  She was diagnosed with excessive daytime somnolence and fatigue, bruxism with chronic insomnia which was followed by dust above.  Looks like Dr. Brett Juarez suggested trying trazodone.  I actually saw her in 2016 for left upper extremity sensory changes, numbness and tingling in the left hand.  At that time she had migraines and was on Topamax, EMG nerve conduction study of the upper arms were normal, I decreased her Topamax to see if it was a side effect.  She is here today as a new request from Dr. Melina Juarez who saw her for degeneration of the cervical intervertebral discs and neck pain with radiculitis/radiculopathy and she has had some new visual field deficits and she is also seeing ophthalmology who did not find any problems with the globes I do not see any other details  about her visual field issues.  I do not see any notes available from ophthalmology in epic or Care Everywhere.  I did find a note from neurology Dr. Sima Juarez at the time she was complaining of 3 mild to severe headaches in a month, 1 severe 1 moderate 1 mild.  Headaches ongoing since 2004 with pain in the frontal bilateral retro-orbital temporal and occipital regions radiating to the neck with blurred vision and visual changes, no nausea phonophobia photophobia or vomiting, symptoms aggravated by food bright light weather changes and emotional stress, maintained on Topamax except she was recently doing well and tapered off.  She is here alone, she injured her arm and has radiculopathy on the left side of the neck, PT has not helped, she has had injections, MRI c-spine clearly shows progression and foraminal narrowing, would like 2nd opinion, I think she may need surgery, will send to [place] surgery. She wants surgery, will send to [facility] for evaluation, we will send along recent MRI of the spine in disk.   But she is also here for vision changes and worsening headaches. Her eyes get blurry when she changes head positions and has fallen but the symptoms have resolved, she saw her ophthemologist. If she extends or flex head and look down and up respectively she get very blurry vision, brief and resolves with positional changes. She hasn't had it for 1-2 months, we happening multiple times a day only with head positioning. Recently  had MRI c-spine June 16,2022.    Reviewed notes, labs and imaging from outside physicians, which showed:  I reviewed MRI from March 2016 imaging which showed C5-C6 disc bulging and joint hypertrophy with moderate by foraminal stenosis, similar but much milder changes at C6-C7.  MRI of the brain showed a very small meningioma not causing any problems otherwise brain parenchyma was normal.  New MRI completed 09/27/2020 showed progression at every level: reviewed images on CD  most significant is moderate to severe bilateral C6-C7 neural foraminal narrowing impinging upon exiting bilateral C7 nerve roots with mild to moderate spinal canal stenosis.  Also moderate severe right and mild to moderate left C5-6 neuroforaminal narrowing impinging upon the exiting right C6 nerve root with mild spinal canal stenosis with minimal deformity of the left hemicord.  From a thorough review of records medications tried that can be used in headache management include:  Cymbalta, Flexeril, Celexa, Topamax, trazodone, gabapentin, rizatriptan, sumatriptan hand, Medrol Dosepak, trazodone April 2021 I found a TSH lab which was normal 1.550  Review of Systems: Patient complains of symptoms per HPI as well as the following symptoms neck pain. Pertinent negatives and positives per HPI. All others negative.   Social History   Socioeconomic History   Marital status: Married    Spouse name: Sandra Juarez   Number of children: 2   Years of education: Masters   Highest education level: Not on file  Occupational History   Not on file  Tobacco Use   Smoking status: Never   Smokeless tobacco: Never  Vaping Use   Vaping Use: Never used  Substance and Sexual Activity   Alcohol use: Yes    Alcohol/week: 6.0 standard drinks    Types: 3 Glasses of wine, 3 Shots of liquor per week   Drug use: No   Sexual activity: Yes    Partners: Male    Birth control/protection: I.U.D.    Comment: Mirena--inserted 04/09/16  Other Topics Concern   Not on file  Social History Narrative   Lives at home with husband and 2 children   Caffeine use: occasionally (2-3/week, includes coffee, diet coke)    Social Determinants of Health   Financial Resource Strain: Not on file  Food Insecurity: Not on file  Transportation Needs: Not on file  Physical Activity: Not on file  Stress: Not on file  Social Connections: Not on file  Intimate Partner Violence: Not on file    Family History  Problem Relation  Age of Onset   Hypertension Brother    Diabetes Brother        AODM   Multiple sclerosis Mother    Cancer Paternal Grandfather 78       dec    Past Medical History:  Diagnosis Date   Abnormal Pap smear of cervix 2010   Anaphylactic reaction    x 2   (hospitalized)   Anxiety    Depression    Encounter for insertion of mirena IUD 03/2011, 03/2016   Laceration of right little finger 10/2016   Migraines    STD (sexually transmitted disease)    HPV    Patient Active Problem List   Diagnosis Date Noted   Chronic insomnia 09/03/2020   Cervical disc disorder at C5-C6 level with radiculopathy 09/03/2020   Excessive daytime sleepiness 09/03/2020   Migraine without aura and without status migrainosus, not intractable 09/03/2020   History of depression 06/22/2019   Paresthesias 06/12/2014   Migraine without aura 06/12/2014   Thrombosed  external hemorrhoid-left lateral 07/30/2011    Past Surgical History:  Procedure Laterality Date   CESAREAN SECTION  2004 ,2006   x 2   COLPOSCOPY  02/2009   LGSIL on Bx   FINGER SURGERY Right 10/2016   Little finger    Current Outpatient Medications  Medication Sig Dispense Refill   citalopram (CELEXA) 20 MG tablet Take 20 mg by mouth daily.  3   EPINEPHrine 0.3 mg/0.3 mL IJ SOAJ injection Inject 0.3 mLs (0.3 mg total) into the muscle as needed for anaphylaxis. 1 each 1   levonorgestrel (MIRENA) 20 MCG/24HR IUD 1 each by Intrauterine route once.     rizatriptan (MAXALT-MLT) 10 MG disintegrating tablet Take 1 tablet by mouth daily as needed.     zolpidem (AMBIEN) 10 MG tablet Take 5-10 mg by mouth at bedtime as needed.     No current facility-administered medications for this visit.    Allergies as of 03/16/2021 - Review Complete 03/16/2021  Allergen Reaction Noted   Lidocaine  07/30/2011    Vitals: BP 100/66   Pulse 60   Ht 5\' 7"  (1.702 m)   Wt 130 lb 9.6 oz (59.2 kg)   BMI 20.45 kg/m  Last Weight:  Wt Readings from Last 1  Encounters:  03/16/21 130 lb 9.6 oz (59.2 kg)   Last Height:   Ht Readings from Last 1 Encounters:  03/16/21 5\' 7"  (1.702 m)     Physical exam: Exam: Gen: NAD, conversant, well nourised, obese, well groomed                     CV: RRR, no MRG. No Carotid Bruits. No peripheral edema, warm, nontender Eyes: Conjunctivae clear without exudates or hemorrhage  Neuro: Detailed Neurologic Exam  Speech:    Speech is normal; fluent and spontaneous with normal comprehension.  Cognition:    The patient is oriented to person, place, and time;     recent and remote memory intact;     language fluent;     normal attention, concentration,     fund of knowledge Cranial Nerves:    The pupils are equal, round, and reactive to light. The fundi are normal and spontaneous venous pulsations are present. Visual fields are full to finger confrontation. Extraocular movements are intact. Trigeminal sensation is intact and the muscles of mastication are normal. The face is symmetric. The palate elevates in the midline. Hearing intact. Voice is normal. Shoulder shrug is normal. The tongue has normal motion without fasciculations.   Coordination:    Normal finger to nose and heel to shin. Normal rapid alternating movements.   Gait:    Heel-toe and tandem gait are normal.   Motor Observation:    No asymmetry, no atrophy, and no involuntary movements noted. Tone:    Normal muscle tone.    Posture:    Posture is normal. normal erect    Strength: left triceps 4/5 otherwise strength is V/V in the upper and lower limbs.      Sensation: intact to LT     Reflex Exam:  DTR's: left triceps reduced, otherwise deep tendon reflexes in the upper and lower extremities are normal bilaterally.   Toes:    The toes are downgoing bilaterally.   Clonus:    Clonus is absent.    Assessment/Plan:  Patient with cervical radiculopathy, weakness triceps muscle and reduced triceps reflex on the lft which corresponds  with MRI cervical spine (sent disk with referral). (New MRI completed  09/27/2020 showed progression at every level: reviewed images on CD most significant is moderate to severe bilateral C6-C7 neural foraminal narrowing impinging upon exiting bilateral C7 nerve roots with mild to moderate spinal canal stenosis.  Also moderate severe right and mild to moderate left C5-6 neuroforaminal narrowing impinging upon the exiting right C6 nerve root with mild spinal canal stenosis with minimal deformity of the left hemicord.)  Vision changes with head flexion or extension with exertional headaches: Recommend MRI brain and MRA head to look for vertebrobasilar insufficiency, chiari, strokes, space occupying mass, aneurysm or or other etiology. She also had meningioma in 2016 should also follow hasn't had imaging in 6 years.  Send to [place] neurosurgery, I think she needs surgery, at least 6 years of left arm radic likely C7, worsening clinically and radiographically (see disc send with referral). On exam left triceps weakness and reduced left triceps reflex c.w left radiculopathy I think she needs surgical intervention and she agrees.   Orders Placed This Encounter  Procedures   MR BRAIN W WO CONTRAST   MR ANGIO HEAD WO CONTRAST   Ambulatory referral to Neurosurgery   Cc: Sandra Schools, MD,  Burnard Bunting, MD  Sarina Ill, MD  [facility] [address] [place], [place] [address]  Phone [phone] Fax [phone]  I spent over 80 minutes of face-to-face and non-face-to-face time with patient on the  1. Cervical radiculopathy   2. Exertional headache   3. Vision changes   4. Vertebrobasilar insufficiency   5. Positional headache   6. Worsening headaches   7. Cervical radiculopathy at C7    diagnosis.  This included previsit chart review, lab review, study review, order entry, electronic health record documentation, patient education on the different  diagnostic and therapeutic options, counseling and coordination of care, risks and benefits of management, compliance, or risk factor reduction

## 2021-03-16 ENCOUNTER — Encounter: Payer: Self-pay | Admitting: Neurology

## 2021-03-16 ENCOUNTER — Ambulatory Visit (INDEPENDENT_AMBULATORY_CARE_PROVIDER_SITE_OTHER): Payer: No Typology Code available for payment source | Admitting: Neurology

## 2021-03-16 VITALS — BP 100/66 | HR 60 | Ht 67.0 in | Wt 130.6 lb

## 2021-03-16 DIAGNOSIS — H539 Unspecified visual disturbance: Secondary | ICD-10-CM | POA: Diagnosis not present

## 2021-03-16 DIAGNOSIS — G4484 Primary exertional headache: Secondary | ICD-10-CM

## 2021-03-16 DIAGNOSIS — R51 Headache with orthostatic component, not elsewhere classified: Secondary | ICD-10-CM

## 2021-03-16 DIAGNOSIS — G450 Vertebro-basilar artery syndrome: Secondary | ICD-10-CM | POA: Diagnosis not present

## 2021-03-16 DIAGNOSIS — R519 Headache, unspecified: Secondary | ICD-10-CM

## 2021-03-16 DIAGNOSIS — M5412 Radiculopathy, cervical region: Secondary | ICD-10-CM | POA: Diagnosis not present

## 2021-03-16 NOTE — Patient Instructions (Addendum)
MRI brain and MRA head Send you to [facility] for evaluation of radiculopathy likely necessitating surgical intervention ongong 8 years now, progressive, corresponding with MRI progression  Cervical Radiculopathy Cervical radiculopathy happens when a nerve in the neck (a cervical nerve) is pinched or bruised. This condition can happen because of an injury to the cervical spine (vertebrae) in the neck, or as part of the normal aging process. Pressure on the cervical nerves can cause pain or numbness that travels from the neck all the way down to the arm and fingers. This condition usually gets better with rest. Treatment may be needed if the condition does not improve. What are the causes? This condition may be caused by: A neck injury. A bulging (herniated) disk. Muscle spasms. Muscle tightness in the neck due to overuse. Arthritis. Breakdown or degeneration in the bones and joints of the spine (spondylosis) due to aging. Bone spurs that may develop near the cervical nerves. What are the signs or symptoms? Symptoms of this condition include: Pain. The pain may travel from the neck to the arm and hand. The pain can be severe or irritating. It may get worse when you move your neck. Numbness or tingling in your arm or hand. Weakness in the affected arm and hand, in severe cases. How is this diagnosed? This condition may be diagnosed based on your symptoms, your medical history, and a physical exam. You may also have tests, including: X-rays. CT scan. MRI. Electromyogram (EMG). Nerve conduction tests. How is this treated? In many cases, treatment is not needed for this condition. With rest, the condition usually gets better over time. If treatment is needed, options may include: Wearing a soft neck collar (cervical collar) for short periods of time. Doing physical therapy to strengthen your neck muscles. Taking medicines. These may include NSAIDs, such as ibuprofen, or oral  corticosteroids. Having spinal injections, in severe cases. Having surgery. This may be needed if other treatments do not help. Different types of surgery may be done depending on the cause of this condition. Follow these instructions at home: If you have a cervical collar: Wear it as told by your health care provider. Remove it only as told by your health care provider. Ask your health care provider if you can remove the cervical collar for cleaning and bathing. If you are allowed to remove the collar for cleaning or bathing: Follow instructions from your health care provider about how to remove the collar safely. Clean the collar by wiping it with mild soap and water and drying it completely. Take out any removable pads in the collar every 1-2 days, and wash them by hand with soap and water. Let them air-dry completely before you put them back in the collar. Check your skin under the collar for irritation or sores. If you see any, tell your health care provider. Managing pain   Take over-the-counter and prescription medicines only as told by your health care provider. If directed, put ice on the affected area. To do this: If you have a soft neck collar, remove it as told by your health care provider. Put ice in a plastic bag. Place a towel between your skin and the bag. Leave the ice on for 20 minutes, 2-3 times a day. Remove the ice if your skin turns bright red. This is very important. If you cannot feel pain, heat, or cold, you have a greater risk of damage to the area. If applying ice does not help, you can try using  heat. Use the heat source that your health care provider recommends, such as a moist heat pack or a heating pad. Place a towel between your skin and the heat source. Leave the heat on for 20-30 minutes. Remove the heat if your skin turns bright red. This is especially important if you are unable to feel pain, heat, or cold. You have a greater risk of getting burned. Try a  gentle neck and shoulder massage to help relieve symptoms. Activity Rest as needed. Return to your normal activities as told by your health care provider. Ask your health care provider what activities are safe for you. Do stretching and strengthening exercises as told by your health care provider or your physical therapist. You may have to avoid lifting. Ask your health care provider how much you can safely lift. General instructions Use a flat pillow when you sleep. Do not drive while wearing a cervical collar. If you do not have a cervical collar, ask your health care provider if it is safe to drive while your neck heals. Ask your health care provider if the medicine prescribed to you requires you to avoid driving or using machinery. Do not use any products that contain nicotine or tobacco. These products include cigarettes, chewing tobacco, and vaping devices, such as e-cigarettes. If you need help quitting, ask your health care provider. Keep all follow-up visits. This is important. Contact a health care provider if: Your condition does not improve with treatment. Get help right away if: Your pain gets much worse and is not controlled with medicines. You have weakness or numbness in your hand, arm, face, or leg. You have a high fever. You have a stiff, rigid neck. You lose control of your bowels or your bladder (have incontinence). You have trouble with walking, balance, or speaking. Summary Cervical radiculopathy happens when a nerve in the neck is pinched or bruised. A nerve can get pinched from a bulging disk, arthritis, muscle spasms, or an injury to the neck. Symptoms include pain, tingling, or numbness radiating from the neck to the arm or hand. Weakness can also occur in severe cases. Treatment may include rest, wearing a cervical collar, and physical therapy. Medicines may be prescribed to help with pain. In severe cases, injections or surgery may be needed. This information is  not intended to replace advice given to you by your health care provider. Make sure you discuss any questions you have with your health care provider. Document Revised: 10/02/2020 Document Reviewed: 10/02/2020 Elsevier Patient Education  [place].

## 2021-03-17 ENCOUNTER — Telehealth: Payer: Self-pay | Admitting: Neurology

## 2021-03-17 NOTE — Telephone Encounter (Signed)
[employer] order sent to [facility], they will obtain the auth and reach out to the patient to schedule.

## 2021-04-15 ENCOUNTER — Other Ambulatory Visit: Payer: Self-pay

## 2021-04-15 ENCOUNTER — Ambulatory Visit
Admission: RE | Admit: 2021-04-15 | Discharge: 2021-04-15 | Disposition: A | Discharge status: Home / Self Care | Payer: No Typology Code available for payment source | Source: Ambulatory Visit | Attending: Neurology | Admitting: Neurology

## 2021-04-15 DIAGNOSIS — R51 Headache with orthostatic component, not elsewhere classified: Secondary | ICD-10-CM | POA: Diagnosis not present

## 2021-04-15 DIAGNOSIS — G4484 Primary exertional headache: Secondary | ICD-10-CM | POA: Diagnosis not present

## 2021-04-15 DIAGNOSIS — G450 Vertebro-basilar artery syndrome: Secondary | ICD-10-CM

## 2021-04-15 DIAGNOSIS — R519 Headache, unspecified: Secondary | ICD-10-CM

## 2021-04-15 DIAGNOSIS — H539 Unspecified visual disturbance: Secondary | ICD-10-CM

## 2021-04-15 MED ORDER — GADOBENATE DIMEGLUMINE 529 MG/ML IV SOLN
12.0000 mL | Freq: Once | INTRAVENOUS | Status: AC | PRN
  Administered 2021-04-15: 12 mL via INTRAVENOUS

## 2021-07-17 ENCOUNTER — Ambulatory Visit (HOSPITAL_BASED_OUTPATIENT_CLINIC_OR_DEPARTMENT_OTHER): Payer: No Typology Code available for payment source | Admitting: Obstetrics & Gynecology

## 2021-08-10 ENCOUNTER — Ambulatory Visit (INDEPENDENT_AMBULATORY_CARE_PROVIDER_SITE_OTHER): Payer: No Typology Code available for payment source | Admitting: Obstetrics & Gynecology

## 2021-08-10 ENCOUNTER — Encounter (HOSPITAL_BASED_OUTPATIENT_CLINIC_OR_DEPARTMENT_OTHER): Payer: Self-pay | Admitting: Obstetrics & Gynecology

## 2021-08-10 VITALS — BP 108/68 | Ht 66.5 in | Wt 124.3 lb

## 2021-08-10 DIAGNOSIS — R87612 Low grade squamous intraepithelial lesion on cytologic smear of cervix (LGSIL): Secondary | ICD-10-CM | POA: Diagnosis not present

## 2021-08-10 DIAGNOSIS — Z8659 Personal history of other mental and behavioral disorders: Secondary | ICD-10-CM

## 2021-08-10 DIAGNOSIS — Z01419 Encounter for gynecological examination (general) (routine) without abnormal findings: Secondary | ICD-10-CM | POA: Diagnosis not present

## 2021-08-10 DIAGNOSIS — Z975 Presence of (intrauterine) contraceptive device: Secondary | ICD-10-CM

## 2021-08-10 DIAGNOSIS — G4709 Other insomnia: Secondary | ICD-10-CM

## 2021-08-10 NOTE — Progress Notes (Signed)
51 y.o. G2P2 Married White or Caucasian female here for annual exam.  Doing well.  Denies vaginal bleeding.  IUD placed 04/09/2016.   ? ?No LMP recorded. (Menstrual status: IUD).          ?Sexually active: Yes.    ?The current method of family planning is IUD.    ?Exercising: Yes.    ?Smoker:  no ? ?Health Maintenance: ?Pap:  06/2020 neg with neg HR HPV ?History of abnormal Pap:  yes/ 2010 ?MMG:  11/2020 ?Colonoscopy:  08/2019, follow up 10 years ?BMD:   n/a ?Screening Labs: Dr Reynaldo Minium ? ? reports that she has never smoked. She has never been exposed to tobacco smoke. She has never used smokeless tobacco. She reports current alcohol use of about 6.0 standard drinks per week. She reports that she does not use drugs. ? ?Past Medical History:  ?Diagnosis Date  ? Abnormal Pap smear of cervix 2010  ? Anaphylactic reaction   ? x 2   (hospitalized)  ? Anxiety   ? Cervical disc disorder   ? Depression   ? Encounter for insertion of mirena IUD 03/2011, 03/2016  ? Laceration of right little finger 10/2016  ? Migraines   ? STD (sexually transmitted disease)   ? HPV  ? ? ?Past Surgical History:  ?Procedure Laterality Date  ? CESAREAN SECTION  2004 ,2006  ? x 2  ? COLPOSCOPY  02/2009  ? LGSIL on Bx  ? FINGER SURGERY Right 10/2016  ? Little finger  ? ? ?Current Outpatient Medications  ?Medication Sig Dispense Refill  ? citalopram (CELEXA) 20 MG tablet Take 20 mg by mouth daily.  3  ? EPINEPHrine 0.3 mg/0.3 mL IJ SOAJ injection Inject 0.3 mLs (0.3 mg total) into the muscle as needed for anaphylaxis. 1 each 1  ? levonorgestrel (MIRENA) 20 MCG/24HR IUD 1 each by Intrauterine route once.    ? rizatriptan (MAXALT-MLT) 10 MG disintegrating tablet Take 1 tablet by mouth daily as needed.    ? zolpidem (AMBIEN) 10 MG tablet Take 5-10 mg by mouth at bedtime as needed.    ? ?No current facility-administered medications for this visit.  ? ? ?Family History  ?Problem Relation Age of Onset  ? Hypertension Brother   ? Diabetes Brother   ?     AODM   ? Multiple sclerosis Mother   ? Cancer Paternal Grandfather 55  ?     dec  ? ? ?A comprehensive review of systems was negative. ? ?Exam:   ?BP 108/68 (BP Location: Right Arm, Patient Position: Sitting, Cuff Size: Small)   Ht 5' 6.5" (1.689 m)   Wt 124 lb 4.8 oz (56.4 kg)   BMI 19.76 kg/m?   Height: 5' 6.5" (168.9 cm) ? ?General appearance: alert, cooperative and appears stated age ?Head: Normocephalic, without obvious abnormality, atraumatic ?Neck: no adenopathy, supple, symmetrical, trachea midline and thyroid normal to inspection and palpation ?Lungs: clear to auscultation bilaterally ?Breasts: normal appearance, no masses or tenderness ?Heart: regular rate and rhythm ?Abdomen: soft, non-tender; bowel sounds normal; no masses,  no organomegaly ?Extremities: extremities normal, atraumatic, no cyanosis or edema ?Skin: Skin color, texture, turgor normal. No rashes or lesions ?Lymph nodes: Cervical, supraclavicular, and axillary nodes normal. ?No abnormal inguinal nodes palpated ?Neurologic: Grossly normal ? ? ?Pelvic: External genitalia:  no lesions ?             Urethra:  normal appearing urethra with no masses, tenderness or lesions ?  Bartholins and Skenes: normal    ?             Vagina: normal appearing vagina with normal color and no discharge, no lesions ?             Cervix: no lesions ?             Pap taken: No. ?Bimanual Exam:  Uterus:  normal size, contour, position, consistency, mobility, non-tender ?             Adnexa: normal adnexa and no mass, fullness, tenderness ?              Rectovaginal: Confirms ?              Anus:  normal sphincter tone, no lesions ? ?Chaperone, Britt Bottom, CMA, was present for exam. ? ?Assessment/Plan: ?1. Well woman exam with routine gynecological exam ?- pap neg with neg HR HPV 2022 ?- MMG 11/2020 ?- colonoscopy completed with 10 year follow up ?- lab work with Dr. Reynaldo Minium  ?- vaccines reviewed/udpated ? ?2. IUD (intrauterine device) in place ?- new 8  year indication discussed.  Placed 03/2016 ? ?3. Other insomnia ?- has seen Dr. Brett Fairy ? ?4. LGSIL on Pap smear of cervix ?- around 2010, normal since ? ?5. History of depression ?- on celexa ? ?

## 2021-10-26 ENCOUNTER — Other Ambulatory Visit: Payer: Self-pay | Admitting: Obstetrics & Gynecology

## 2021-10-26 DIAGNOSIS — Z1231 Encounter for screening mammogram for malignant neoplasm of breast: Secondary | ICD-10-CM

## 2021-11-18 ENCOUNTER — Ambulatory Visit: Payer: No Typology Code available for payment source

## 2021-11-30 ENCOUNTER — Ambulatory Visit
Admission: RE | Admit: 2021-11-30 | Discharge: 2021-11-30 | Disposition: A | Discharge status: Home / Self Care | Payer: No Typology Code available for payment source | Source: Ambulatory Visit | Attending: Obstetrics & Gynecology | Admitting: Obstetrics & Gynecology

## 2021-11-30 DIAGNOSIS — Z1231 Encounter for screening mammogram for malignant neoplasm of breast: Secondary | ICD-10-CM

## 2021-12-02 ENCOUNTER — Other Ambulatory Visit: Payer: Self-pay | Admitting: Obstetrics & Gynecology

## 2021-12-02 DIAGNOSIS — R928 Other abnormal and inconclusive findings on diagnostic imaging of breast: Secondary | ICD-10-CM

## 2021-12-07 ENCOUNTER — Ambulatory Visit
Admission: RE | Admit: 2021-12-07 | Discharge: 2021-12-07 | Disposition: A | Discharge status: Home / Self Care | Payer: No Typology Code available for payment source | Source: Ambulatory Visit | Attending: Obstetrics & Gynecology | Admitting: Obstetrics & Gynecology

## 2021-12-07 ENCOUNTER — Other Ambulatory Visit: Payer: Self-pay | Admitting: Obstetrics & Gynecology

## 2021-12-07 DIAGNOSIS — R928 Other abnormal and inconclusive findings on diagnostic imaging of breast: Secondary | ICD-10-CM

## 2021-12-17 ENCOUNTER — Ambulatory Visit
Admission: RE | Admit: 2021-12-17 | Discharge: 2021-12-17 | Disposition: A | Discharge status: Home / Self Care | Payer: No Typology Code available for payment source | Source: Ambulatory Visit | Attending: Obstetrics & Gynecology | Admitting: Obstetrics & Gynecology

## 2021-12-17 ENCOUNTER — Other Ambulatory Visit: Payer: Self-pay | Admitting: Obstetrics & Gynecology

## 2021-12-17 DIAGNOSIS — R928 Other abnormal and inconclusive findings on diagnostic imaging of breast: Secondary | ICD-10-CM

## 2022-05-17 ENCOUNTER — Other Ambulatory Visit: Payer: Self-pay | Admitting: Obstetrics & Gynecology

## 2022-05-17 DIAGNOSIS — Z1231 Encounter for screening mammogram for malignant neoplasm of breast: Secondary | ICD-10-CM

## 2022-09-21 ENCOUNTER — Ambulatory Visit (INDEPENDENT_AMBULATORY_CARE_PROVIDER_SITE_OTHER): Payer: No Typology Code available for payment source | Admitting: Obstetrics & Gynecology

## 2022-09-21 ENCOUNTER — Encounter (HOSPITAL_BASED_OUTPATIENT_CLINIC_OR_DEPARTMENT_OTHER): Payer: Self-pay | Admitting: Obstetrics & Gynecology

## 2022-09-21 VITALS — BP 108/76 | HR 59 | Ht 67.0 in | Wt 129.2 lb

## 2022-09-21 DIAGNOSIS — Z975 Presence of (intrauterine) contraceptive device: Secondary | ICD-10-CM

## 2022-09-21 DIAGNOSIS — G43009 Migraine without aura, not intractable, without status migrainosus: Secondary | ICD-10-CM | POA: Diagnosis not present

## 2022-09-21 DIAGNOSIS — N912 Amenorrhea, unspecified: Secondary | ICD-10-CM

## 2022-09-21 DIAGNOSIS — Z01419 Encounter for gynecological examination (general) (routine) without abnormal findings: Secondary | ICD-10-CM | POA: Diagnosis not present

## 2022-09-21 DIAGNOSIS — F5104 Psychophysiologic insomnia: Secondary | ICD-10-CM

## 2022-09-21 DIAGNOSIS — N649 Disorder of breast, unspecified: Secondary | ICD-10-CM

## 2022-09-21 NOTE — Progress Notes (Addendum)
52 y.o. G2P2 Married White or Caucasian female here for annual exam.  Biggest complaint is memory and recall.  Feels like she is forgetting things she shouldn't.  Reports this just feels like a lot of brain fog.  Wants to really try to get of sleeping medication.  This is written by Dr. Jacky Kindle.    She did see Dr. Vickey Huger in 2022.  She did have a sleep study as well.    Doesn't really have bleeding.    Headaches have been really good.    Having some personal stressors.  Feel therapy would be helpful for her.  No LMP recorded. (Menstrual status: IUD).          Sexually active: Yes.    Exercising: Yes.    walking Smoker:  no  Health Maintenance: Pap:  07/04/2020 Negative History of abnormal Pap:  LGSIL in 2010 MMG:  11/30/2021 Needed additional images.  MMG scheduled 11/2022 Colonoscopy:  08/22/2019, follow up 10 years BMD:   not indicated Screening Labs: does with Dr. Lorain Childes   reports that she has never smoked. She has never been exposed to tobacco smoke. She has never used smokeless tobacco. She reports current alcohol use of about 6.0 standard drinks of alcohol per week. She reports that she does not use drugs.  Past Medical History:  Diagnosis Date   Abnormal Pap smear of cervix 2010   Anaphylactic reaction    x 2   (hospitalized)   Anxiety    Cervical disc disorder    Depression    Encounter for insertion of mirena IUD 03/2011, 03/2016   Laceration of right little finger 10/2016   Migraines    STD (sexually transmitted disease)    HPV    Past Surgical History:  Procedure Laterality Date   CESAREAN SECTION  2004 ,2006   x 2   COLPOSCOPY  02/2009   LGSIL on Bx   FINGER SURGERY Right 10/2016   Little finger    Current Outpatient Medications  Medication Sig Dispense Refill   citalopram (CELEXA) 20 MG tablet Take 20 mg by mouth daily.  3   EPINEPHrine 0.3 mg/0.3 mL IJ SOAJ injection Inject 0.3 mLs (0.3 mg total) into the muscle as needed for anaphylaxis. 1 each 1    levonorgestrel (MIRENA) 20 MCG/24HR IUD 1 each by Intrauterine route once.     rizatriptan (MAXALT-MLT) 10 MG disintegrating tablet Take 1 tablet by mouth daily as needed.     zolpidem (AMBIEN) 10 MG tablet Take 5-10 mg by mouth at bedtime as needed.     No current facility-administered medications for this visit.    Family History  Problem Relation Age of Onset   Hypertension Brother    Diabetes Brother        AODM   Multiple sclerosis Mother    Cancer Paternal Grandfather 6       dec    ROS: Constitutional: negative Genitourinary:negative  Exam:   BP 108/76 (BP Location: Left Arm, Patient Position: Sitting, Cuff Size: Normal)   Pulse (!) 59   Ht 5\' 7"  (1.702 m) Comment: Reported  Wt 129 lb 3.2 oz (58.6 kg)   BMI 20.24 kg/m   Height: 5\' 7"  (170.2 cm) (Reported)  General appearance: alert, cooperative and appears stated age Head: Normocephalic, without obvious abnormality, atraumatic Neck: no adenopathy, supple, symmetrical, trachea midline and thyroid normal to inspection and palpation Lungs: clear to auscultation bilaterally Breasts: normal appearance, no masses or tenderness, small pea sized cystic feeling  lesion just above nipple Heart: regular rate and rhythm Abdomen: soft, non-tender; bowel sounds normal; no masses,  no organomegaly Extremities: extremities normal, atraumatic, no cyanosis or edema Skin: Skin color, texture, turgor normal. No rashes or lesions Lymph nodes: Cervical, supraclavicular, and axillary nodes normal. No abnormal inguinal nodes palpated Neurologic: Grossly normal   Pelvic: External genitalia:  no lesions              Urethra:  normal appearing urethra with no masses, tenderness or lesions              Bartholins and Skenes: normal                 Vagina: normal appearing vagina with normal color and no discharge, no lesions              Cervix: no lesions              Pap taken: No. Bimanual Exam:  Uterus:  normal size, contour,  position, consistency, mobility, non-tender              Adnexa: normal adnexa and no mass, fullness, tenderness               Rectovaginal: Confirms               Anus:  normal sphincter tone, no lesions  Chaperone, Ina Homes, CMA, was present for exam.  Assessment/Plan: 1. Well woman exam with routine gynecological exam - Pap smear in 2022.  Repeat next year - Mammogram scheduled 11/2022 - Colonoscopy 2021.  Follow up 10 years. - Bone mineral density discussed.  Will plan closer to age 89 - lab work done with PCP, Dr. Jacky Kindle - vaccines reviewed/updated  2. Amenorrhea - Follicle stimulating hormone  3. IUD (intrauterine device) in place - plan removal next year  4. Migraine without aura and without status migrainosus, not intractable - much improved  5. Chronic insomnia - on Ambien.  She is consider trying to really wean off this during the next year.  6.  Breast lesion - pt is going to monitor this and if still present in 4 weeks, she will let me know and imaging will be ordered  7.  Personal stressors - will ask local mental health provider for some possible names for pt

## 2022-09-22 LAB — FOLLICLE STIMULATING HORMONE: FSH: 12.8 m[IU]/mL

## 2022-10-18 ENCOUNTER — Encounter (HOSPITAL_BASED_OUTPATIENT_CLINIC_OR_DEPARTMENT_OTHER): Payer: Self-pay | Admitting: Obstetrics & Gynecology

## 2022-10-18 ENCOUNTER — Ambulatory Visit (INDEPENDENT_AMBULATORY_CARE_PROVIDER_SITE_OTHER): Payer: No Typology Code available for payment source | Admitting: Obstetrics & Gynecology

## 2022-10-18 VITALS — BP 109/85 | HR 66 | Ht 67.0 in | Wt 128.0 lb

## 2022-10-18 DIAGNOSIS — N6311 Unspecified lump in the right breast, upper outer quadrant: Secondary | ICD-10-CM

## 2022-10-18 NOTE — Progress Notes (Signed)
GYNECOLOGY  VISIT  CC:   right breast lesion, tenderness  HPI: 52 y.o. G2P2 Married White or Caucasian female here for complaint of tender area on right breast.  No recent trauma.  No nipple discharge or blood.  Just got back from [place] but does have upcoming beach trip. Last MMG was 11/2021 and she ended up having breast biopsy benign findings as per below.    Breast, right, needle core biopsy, indeterminate calcifications - BENIGN BREAST TISSUE WITH STROMAL FIBROSIS, FOCAL BENIGN ADENOSIS AND CALCIFICATIONS. - FRAGMENT OF BENIGN SKIN. - NEGATIVE FOR MALIGNANCY.    Past Medical History:  Diagnosis Date   Abnormal Pap smear of cervix 2010   Anaphylactic reaction    x 2   (hospitalized)   Anxiety    Cervical disc disorder    Depression    Encounter for insertion of mirena IUD 03/2011, 03/2016   Laceration of right little finger 10/2016   Migraines    STD (sexually transmitted disease)    HPV    MEDS:   Current Outpatient Medications on File Prior to Visit  Medication Sig Dispense Refill   citalopram (CELEXA) 20 MG tablet Take 20 mg by mouth daily.  3   EPINEPHrine 0.3 mg/0.3 mL IJ SOAJ injection Inject 0.3 mLs (0.3 mg total) into the muscle as needed for anaphylaxis. 1 each 1   levonorgestrel (MIRENA) 20 MCG/24HR IUD 1 each by Intrauterine route once.     rizatriptan (MAXALT-MLT) 10 MG disintegrating tablet Take 1 tablet by mouth daily as needed.     zolpidem (AMBIEN) 10 MG tablet Take 5-10 mg by mouth at bedtime as needed.     No current facility-administered medications on file prior to visit.    ALLERGIES: Lidocaine  SH:  married, non smoker  Review of Systems  Constitutional: Negative.     PHYSICAL EXAMINATION:    BP 109/85   Pulse 66   Ht 5\' 7"  (1.702 m)   Wt 128 lb (58.1 kg)   BMI 20.05 kg/m     Physical Exam Constitutional:      Appearance: Normal appearance.  Chest:    Lymphadenopathy:     Cervical: No cervical adenopathy.  Neurological:      Mental Status: She is alert.      Assessment/Plan: 1. Mass of upper outer quadrant of right breast - MM 3D DIAGNOSTIC MAMMOGRAM UNILATERAL RIGHT BREAST; Future - [place] LIMITED ULTRASOUND INCLUDING AXILLA RIGHT BREAST; Future

## 2022-10-18 NOTE — Patient Instructions (Signed)
Call [phone] to schedule an appointment at [facility].    Call [phone] to scheduled at the [facility], [address], [address].  [place], [place] [address]  Mammograms can also be self-scheduled online through MyChart.  

## 2022-10-20 ENCOUNTER — Ambulatory Visit
Admission: RE | Admit: 2022-10-20 | Discharge: 2022-10-20 | Disposition: A | Discharge status: Home / Self Care | Payer: No Typology Code available for payment source | Source: Ambulatory Visit | Attending: Obstetrics & Gynecology | Admitting: Obstetrics & Gynecology

## 2022-10-20 DIAGNOSIS — N6311 Unspecified lump in the right breast, upper outer quadrant: Secondary | ICD-10-CM

## 2022-11-05 ENCOUNTER — Other Ambulatory Visit: Payer: Self-pay | Admitting: Obstetrics & Gynecology

## 2022-11-05 DIAGNOSIS — R921 Mammographic calcification found on diagnostic imaging of breast: Secondary | ICD-10-CM

## 2022-12-03 ENCOUNTER — Ambulatory Visit
Admission: RE | Admit: 2022-12-03 | Discharge: 2022-12-03 | Disposition: A | Discharge status: Home / Self Care | Payer: No Typology Code available for payment source | Source: Ambulatory Visit | Attending: Obstetrics & Gynecology | Admitting: Obstetrics & Gynecology

## 2022-12-03 ENCOUNTER — Ambulatory Visit: Payer: No Typology Code available for payment source

## 2022-12-03 DIAGNOSIS — R921 Mammographic calcification found on diagnostic imaging of breast: Secondary | ICD-10-CM

## 2023-09-30 ENCOUNTER — Ambulatory Visit (HOSPITAL_BASED_OUTPATIENT_CLINIC_OR_DEPARTMENT_OTHER): Payer: No Typology Code available for payment source | Admitting: Obstetrics & Gynecology

## 2023-09-30 ENCOUNTER — Other Ambulatory Visit (HOSPITAL_COMMUNITY)
Admission: RE | Admit: 2023-09-30 | Discharge: 2023-09-30 | Disposition: A | Discharge status: Home / Self Care | Source: Ambulatory Visit | Attending: Obstetrics & Gynecology | Admitting: Obstetrics & Gynecology

## 2023-09-30 ENCOUNTER — Encounter (HOSPITAL_BASED_OUTPATIENT_CLINIC_OR_DEPARTMENT_OTHER): Payer: Self-pay | Admitting: Obstetrics & Gynecology

## 2023-09-30 VITALS — BP 104/71 | HR 63 | Ht 67.25 in | Wt 126.0 lb

## 2023-09-30 DIAGNOSIS — Z01419 Encounter for gynecological examination (general) (routine) without abnormal findings: Secondary | ICD-10-CM | POA: Diagnosis not present

## 2023-09-30 DIAGNOSIS — Z124 Encounter for screening for malignant neoplasm of cervix: Secondary | ICD-10-CM | POA: Diagnosis present

## 2023-09-30 DIAGNOSIS — T8332XA Displacement of intrauterine contraceptive device, initial encounter: Secondary | ICD-10-CM

## 2023-09-30 DIAGNOSIS — R413 Other amnesia: Secondary | ICD-10-CM | POA: Diagnosis not present

## 2023-09-30 DIAGNOSIS — G43009 Migraine without aura, not intractable, without status migrainosus: Secondary | ICD-10-CM

## 2023-09-30 DIAGNOSIS — F5104 Psychophysiologic insomnia: Secondary | ICD-10-CM

## 2023-09-30 NOTE — Progress Notes (Addendum)
 ANNUAL EXAM Patient name: Sandra Juarez MRN [id number]  Date of birth: 04/07/1971 Chief Complaint:   Annual Exam  History of Present Illness:   Sandra Juarez is a 53 y.o. G2P2 Caucasian female being seen today for a routine annual exam.  Sons are at [employer] and her younger son with go to [facility].   Last year we discussed brain fog and some memory changes.  She continues to feel this is present and possibly more this year.  Two aunts with Alzheimer's.  One died prematurely form this.  Parents are in their 80's and, of course, they have memory changes.  She's feels their changes are likely age related.  Having lots of trouble with name recall.  Hasn't gotten lost but feels she makes mistakes with driving by going the wrong way.  Constantly checking her calendar to make sure she doesn't miss something.    Does need IUD removal.  FSH was 12.8 last year.    No LMP recorded. (Menstrual status: IUD).   Last pap 06/14/2020 Negativ. Results were: NILM w/ HRHPV negative. H/O abnormal pap: yes Last mammogram: 12/03/2022. Results were: normal. Family h/o breast cancer: yes Last colonoscopy: 08/22/2019. 10 year follow up.     09/30/2023    8:33 AM 09/21/2022    9:00 AM  Depression screen PHQ 2/9  Decreased Interest 0 0  Down, Depressed, Hopeless 0 0  PHQ - 2 Score 0 0    Review of Systems:   Pertinent items are noted in HPI Denies any urinary or bowel changes.  No pelvic pain.   Pertinent History Reviewed:  Reviewed past medical,surgical, social and family history.  Reviewed problem list, medications and allergies. Physical Assessment:   Vitals:   09/30/23 0832  BP: 104/71  Pulse: 63  Weight: 126 lb (57.2 kg)  Height: 5' 7.25 (1.708 m)  Body mass index is 19.59 kg/m.        Physical Examination:   General appearance - well appearing, and in no distress  Mental status - alert, oriented to person, place, and time  Psych:  She has a normal mood and affect  Skin - warm and  dry, normal color, no suspicious lesions noted  Chest - effort normal, all lung fields clear to auscultation bilaterally  Heart - normal rate and regular rhythm  Neck:  midline trachea, no thyromegaly or nodules  Breasts - breasts appear normal, no suspicious masses, no skin or nipple changes or  axillary nodes  Abdomen - soft, nontender, nondistended, no masses or organomegaly  Pelvic - VULVA: normal appearing vulva with no masses, tenderness or lesions   VAGINA: normal appearing vagina with normal color and discharge, no lesions   CERVIX: normal appearing cervix without discharge or lesions, no CMT, no IUD string noted  Thin prep pap is done  UTERUS: uterus is felt to be normal size, shape, consistency and nontender   ADNEXA: No adnexal masses or tenderness noted.  Rectal - exam defered  Extremities:  No swelling or varicosities noted  Chaperone present for exam  Assessment & Plan:  1. Well woman exam with routine gynecological exam (Primary) - Pap smear updated today - Mammogram 11/2022 - Colonoscopy 2021.  Follow up 10 years. - Bone mineral density discussed.   - lab work done with PCP, Dr. Delorise Few - vaccines reviewed/updated  2. Cervical cancer screening - Cytology - PAP( [facility])  3. Memory loss - Ambulatory referral to Neurology  4. Migraine without aura and  without status migrainosus, not intractable  5. Chronic insomnia  6. Intrauterine contraceptive device threads lost, initial encounter - cannot see IUD string today.  Will have pt return for IUD removal with u/s guidance.   Meds: No orders of the defined types were placed in this encounter.   Follow-up: Return in about 1 year (around 09/29/2024).  Lillian Rein, MD 09/30/2023 9:07 AM

## 2023-10-06 ENCOUNTER — Ambulatory Visit (HOSPITAL_BASED_OUTPATIENT_CLINIC_OR_DEPARTMENT_OTHER): Payer: Self-pay | Admitting: Certified Nurse Midwife

## 2023-10-06 LAB — CYTOLOGY - PAP
Comment: NEGATIVE
Diagnosis: NEGATIVE
High risk HPV: NEGATIVE

## 2023-10-26 ENCOUNTER — Ambulatory Visit (HOSPITAL_BASED_OUTPATIENT_CLINIC_OR_DEPARTMENT_OTHER)

## 2023-10-26 ENCOUNTER — Ambulatory Visit (HOSPITAL_BASED_OUTPATIENT_CLINIC_OR_DEPARTMENT_OTHER): Admitting: Obstetrics & Gynecology

## 2023-10-26 ENCOUNTER — Other Ambulatory Visit (HOSPITAL_BASED_OUTPATIENT_CLINIC_OR_DEPARTMENT_OTHER): Payer: Self-pay | Admitting: Obstetrics & Gynecology

## 2023-10-26 VITALS — Wt 127.4 lb

## 2023-10-26 DIAGNOSIS — T8332XA Displacement of intrauterine contraceptive device, initial encounter: Secondary | ICD-10-CM | POA: Diagnosis not present

## 2023-10-26 DIAGNOSIS — N8302 Follicular cyst of left ovary: Secondary | ICD-10-CM | POA: Diagnosis not present

## 2023-10-26 DIAGNOSIS — Z30432 Encounter for removal of intrauterine contraceptive device: Secondary | ICD-10-CM

## 2023-10-26 DIAGNOSIS — N912 Amenorrhea, unspecified: Secondary | ICD-10-CM | POA: Diagnosis not present

## 2023-10-27 LAB — FOLLICLE STIMULATING HORMONE: FSH: 5.2 m[IU]/mL

## 2023-10-29 ENCOUNTER — Ambulatory Visit (HOSPITAL_BASED_OUTPATIENT_CLINIC_OR_DEPARTMENT_OTHER): Payer: Self-pay | Admitting: Obstetrics & Gynecology

## 2023-10-29 ENCOUNTER — Encounter (HOSPITAL_BASED_OUTPATIENT_CLINIC_OR_DEPARTMENT_OTHER): Payer: Self-pay | Admitting: Obstetrics & Gynecology

## 2023-10-29 NOTE — Progress Notes (Signed)
 53 y.o. G2P2 Married Caucasian female presents for removal of Mirena  IUD.  At prior visit, IUD string not visualized and pt her for removal with ultrasound guidance.  IUD string could be seen in the cervix on ultrasound.  Uterus normal on ultrasound.  Left ovary does have a 22mm follicle.  Will check FSH today to see if pt will need other options for contraception.    Pt has also been counseled about risks and benefits as well as complications.  Consent is obtained today.  All questions answered prior to start of procedure.     Review of Systems  Constitutional: Negative.   Genitourinary: Negative.    Vitals:   10/26/23 1441  Weight: 127 lb 6.4 oz (57.8 kg)    Gen:  WNWF healthy female NAD Abdomen: soft, non-tender Groin:  no inguinal nodes palpated  Pelvic exam: Vulva:  normal female genitalia Vagina:  normal vagina Cervix:  Non-tender, Negative CMT, no lesions or redness.  No string visualized.  Procedure:  Speculum placed.  Cervix visualized and cleansed with Betadine x 3.  Single toothed tenaculum applied to anterior lip of cervix without difficulty.  Endometrial curette was obtained to pass through cervix for grasping IUD string.  Pt had significant discomfort with attempting passage through the cervical os.  As string was seen in the cervix, tonsil forceps was obtained.  With second attempt, string was grasped in cervix and IUD removed intact.  Pt visualized IUD prior to discarding.  Tenaculum removed.  She tolerated procedure well with minimal cramping.  All instruments removed from vagina.  Assessment/Plan: 1. Encounter for IUD removal (Primary) - precautions given.  She is traveling to [place] tomorrow.  Pelvic rest for at least 24 hours recommended.  2. Amenorrhea - FSH ordered today as well as follicle was seen on ovary.  Will let pt know if she needs to consider additional options for contraception.

## 2023-11-24 DIAGNOSIS — M25562 Pain in left knee: Secondary | ICD-10-CM | POA: Insufficient documentation

## 2024-01-04 ENCOUNTER — Other Ambulatory Visit: Payer: Self-pay | Admitting: Obstetrics & Gynecology

## 2024-01-04 DIAGNOSIS — Z0000 Encounter for general adult medical examination without abnormal findings: Secondary | ICD-10-CM

## 2024-01-06 ENCOUNTER — Encounter (HOSPITAL_BASED_OUTPATIENT_CLINIC_OR_DEPARTMENT_OTHER): Payer: Self-pay | Admitting: Obstetrics & Gynecology

## 2024-01-16 ENCOUNTER — Ambulatory Visit
Admission: RE | Admit: 2024-01-16 | Discharge: 2024-01-16 | Disposition: A | Discharge status: Home / Self Care | Source: Ambulatory Visit | Attending: Obstetrics & Gynecology | Admitting: Obstetrics & Gynecology

## 2024-01-16 DIAGNOSIS — Z0000 Encounter for general adult medical examination without abnormal findings: Secondary | ICD-10-CM

## 2024-01-17 ENCOUNTER — Other Ambulatory Visit: Payer: Self-pay | Admitting: Obstetrics & Gynecology

## 2024-01-17 DIAGNOSIS — R921 Mammographic calcification found on diagnostic imaging of breast: Secondary | ICD-10-CM

## 2024-01-18 ENCOUNTER — Encounter (HOSPITAL_BASED_OUTPATIENT_CLINIC_OR_DEPARTMENT_OTHER): Payer: Self-pay | Admitting: Obstetrics & Gynecology

## 2024-01-19 ENCOUNTER — Ambulatory Visit
Admission: RE | Admit: 2024-01-19 | Discharge: 2024-01-19 | Disposition: A | Discharge status: Home / Self Care | Source: Ambulatory Visit | Attending: Obstetrics & Gynecology | Admitting: Obstetrics & Gynecology

## 2024-01-19 ENCOUNTER — Other Ambulatory Visit: Payer: Self-pay | Admitting: Obstetrics & Gynecology

## 2024-01-19 DIAGNOSIS — R921 Mammographic calcification found on diagnostic imaging of breast: Secondary | ICD-10-CM

## 2024-01-23 ENCOUNTER — Ambulatory Visit
Admission: RE | Admit: 2024-01-23 | Discharge: 2024-01-23 | Disposition: A | Discharge status: Home / Self Care | Source: Ambulatory Visit | Attending: Obstetrics & Gynecology | Admitting: Obstetrics & Gynecology

## 2024-01-23 DIAGNOSIS — R921 Mammographic calcification found on diagnostic imaging of breast: Secondary | ICD-10-CM

## 2024-01-23 HISTORY — PX: BREAST BIOPSY: SHX20

## 2024-01-24 LAB — SURGICAL PATHOLOGY

## 2024-01-25 ENCOUNTER — Encounter

## 2024-01-25 ENCOUNTER — Telehealth: Payer: Self-pay | Admitting: *Deleted

## 2024-01-25 NOTE — Telephone Encounter (Signed)
 Spoke to patient to confirm upcoming afternoon [facility] clinic appointment on 10/22, paperwork will be sent via email.  Gave location and time, also informed patient that the surgeon's office would be calling as well to get information from them similar to the packet that they will be receiving so make sure to do both.  Reminded patient that all providers will be coming to the clinic to see them HERE and if they had any questions to not hesitate to reach back out to myself or their navigators.

## 2024-01-30 ENCOUNTER — Encounter: Payer: Self-pay | Admitting: *Deleted

## 2024-01-30 DIAGNOSIS — D0511 Intraductal carcinoma in situ of right breast: Secondary | ICD-10-CM | POA: Insufficient documentation

## 2024-01-30 NOTE — Progress Notes (Signed)
 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 female seen in consultation for radiation therapy.  Ms. Burgoon has a hx of an abnl mammogram dating back to August of 2023 at which time R breast calcifications were identified warranting further evaluation with magnified views. A diagnostic mammogram on the R was obtained and demonstrated an indeterminate 0.4 cm group of calcifications in the inner retroareolar R breast. This was biopsied in September 2023 which demonstrated stromal fibrosis, focal benign adenosis and calcifications.   She underwent repeat diagnostic mammogram in July 2024 in setting of palpable lump within the R breast. This revealed loosely grouped punctate calcifications within the upper and inner R breast not significantly changed from prior. No evidence of malignancy at the palpable area of concern in the upper R breast at 12 o'clock. Repeat diagnostic mammogram in October 2025 demonstrated an indeterminate 7 mm group of round and linear branching calcifications in the upper inner R breast. Previously biopsied central breast calcifications stable and consistent with benign etiology. No evidence of L breast malignancy identified on any of these scans.   This new area of calcifications was biopsied on 01/23/24 and returned as ductal carcinoma in situ, cribiform and comedo, intermediate grade, ER+ (95%) and PR+ (99%).  Estrogen exposure history:  Childbearing/breastfeeding:   Family history of cancer:  PREVIOUS RADIATION THERAPY: {EXAM; YES/NO:19492::No}  AUTOIMMUNE DISEASE: {EXAM; YES/NO:19492::No}  MEDICAL DEVICES: {EXAM;  YES/NO:19492::No}  PREGNANCY: {EXAM; YES/NO:19492::No}   PAST MEDICAL HISTORY:  Past Medical History:  Diagnosis Date   Abnormal Pap smear of cervix 2010   Anaphylactic reaction    x 2   (hospitalized)   Anxiety    Cervical disc disorder    Depression    Encounter for insertion of Mirena  IUD 03/2011, 03/2016   Laceration of right little finger 10/2016   Migraines    STD (sexually transmitted disease)    HPV       PAST SURGICAL HISTORY: Past Surgical History:  Procedure Laterality Date   BREAST BIOPSY Right 01/23/2024   MM RT BREAST BX W LOC DEV 1ST LESION IMAGE BX SPEC STEREO GUIDE 01/23/2024 [facility]   CESAREAN SECTION  2004 ,2006   x 2   COLPOSCOPY  02/2009   LGSIL on Bx   FINGER SURGERY Right 10/2016   Little finger     FAMILY HISTORY:  Family History  Problem Relation Age of Onset   Multiple sclerosis Mother    Cancer Paternal Grandfather 71       dec   Hypertension Brother    Diabetes Brother        AODM   Breast cancer Neg Hx      SOCIAL HISTORY:  reports that she has never smoked. She has never been exposed to tobacco smoke. She has never used smokeless tobacco. She reports current alcohol use of about 6.0 standard drinks of alcohol per week. She reports that she does not use drugs.   ALLERGIES: Lidocaine   MEDICATIONS:  Current Outpatient Medications  Medication Sig Dispense Refill   citalopram (CELEXA) 20 MG tablet Take 20 mg by mouth daily.  3  EPINEPHrine  0.3 mg/0.3 mL IJ SOAJ injection Inject 0.3 mLs (0.3 mg total) into the muscle as needed for anaphylaxis. 1 each 1   levonorgestrel  (MIRENA ) 20 MCG/24HR IUD 1 each by Intrauterine route once.     rizatriptan (MAXALT-MLT) 10 MG disintegrating tablet Take 1 tablet by mouth daily as needed.     zolpidem (AMBIEN) 10 MG tablet Take 5-10 mg by mouth at bedtime as needed.     No current facility-administered medications for this visit.     REVIEW OF SYSTEMS: The patient reports  that s***/he is doing well overall and a review of symptoms is otherwise negative.      PHYSICAL EXAM:  Wt Readings from Last 3 Encounters:  10/26/23 127 lb 6.4 oz (57.8 kg)  09/30/23 126 lb (57.2 kg)  10/18/22 128 lb (58.1 kg)   Temp Readings from Last 3 Encounters:  06/21/19 98.2 F (36.8 C) (Temporal)  07/30/11 98.3 F (36.8 C) (Temporal)   BP Readings from Last 3 Encounters:  09/30/23 104/71  10/18/22 109/85  09/21/22 108/76   Pulse Readings from Last 3 Encounters:  09/30/23 63  10/18/22 66  09/21/22 (!) 59    /10   Physical Exam   Breast Exam:  Well-healed lumpectomy/re-excision scar in the *** quadrant. No erythema, induration, or drainage. No palpable masses or nodularity at the surgical site. No skin changes, nipple inversion, or discharge.   ECOG = ***   LABORATORY DATA:  No results found for: WBC, HGB, HCT, MCV, PLT No results found for: NA, K, CL, CO2 Lab Results  Component Value Date   ALT 16 06/28/2014   AST 17 06/28/2014   ALKPHOS 32 (L) 06/28/2014   BILITOT 0.5 06/28/2014      RADIOGRAPHY:  MM RT BREAST BX W LOC DEV 1ST LESION IMAGE BX SPEC STEREO GUIDE Addendum Date: 01/24/2024 ADDENDUM REPORT: 01/24/2024 11:03 ADDENDUM: PATHOLOGY revealed: 1. Breast, right, needle core biopsy, calcifications, upper inner quadrant. (clip x) - DUCTAL CARCINOMA IN SITU, CRIBRIFORM AND COMEDO, INTERMEDIATE NUCLEAR GRADE - NECROSIS: PRESENT - CALCIFICATIONS: PRESENT - DCIS LENGTH: 0.4 CM. Pathology results are CONCORDANT with imaging findings, per Dr. Aliene Mir. Pathology results and recommendations were discussed with patient via telephone on 01/24/2024 by Jacquline Cooter RN. Patient reported biopsy site doing well with no adverse symptoms, and slight tenderness at the site. Post biopsy care instructions were reviewed, questions were answered and my direct phone number was provided. Patient was instructed to call [facility]  Imaging for any additional questions or concerns related to biopsy site. Recommendation: Patient was referred to the [facility] at [facility] with appointment on February 01, 2024. Pathology results reported by Jacquline Cooter RN on 01/24/2024. Electronically Signed   By: Aliene Lloyd M.D.   On: 01/24/2024 11:03   Result Date: 01/24/2024 CLINICAL DATA:  53 year old woman with indeterminate 0.7 cm group of RIGHT breast calcifications presents for stereotactic guided biopsy. EXAM: RIGHT BREAST STEREOTACTIC CORE NEEDLE BIOPSY COMPARISON:  Previous exam(s). FINDINGS: The patient and I discussed the procedure of stereotactic-guided biopsy including benefits and alternatives. We discussed the high likelihood of a successful procedure. We discussed the risks of the procedure including infection, bleeding, tissue injury, clip migration, and inadequate sampling. Informed written consent was given. The usual time out protocol was performed immediately prior to the procedure. Using sterile technique and 1% Nesacaine (3 mL total) as local anesthetic, under stereotactic guidance, a 9 gauge vacuum assisted device was  used to perform core needle biopsy of calcifications in the upper inner quadrant of the RIGHT breast using a medial approach. Specimen radiograph was performed showing calcification. Specimens with calcifications are identified for pathology. Lesion quadrant: Upper inner quadrant At the conclusion of the procedure, X shaped tissue marker clip was deployed into the biopsy cavity. Follow-up 2-view mammogram was performed and dictated separately. IMPRESSION: Stereotactic-guided biopsy of RIGHT upper inner quadrant calcifications. No apparent complications. Electronically Signed: By: Aliene Lloyd M.D. On: 01/23/2024 10:45   MM CLIP PLACEMENT RIGHT Result Date: 01/23/2024 CLINICAL DATA:  Status post stereotactic guided biopsy of RIGHT upper inner breast  calcifications EXAM: 3D DIAGNOSTIC RIGHT MAMMOGRAM POST STEREOTACTIC BIOPSY COMPARISON:  Previous exam(s). ACR Breast Density Category d: The breasts are extremely dense, which lowers the sensitivity of mammography. FINDINGS: 3D Mammographic images were obtained following stereotactic guided biopsy of RIGHT upper inner breast calcifications. The biopsy marking clip is in expected position at the site of biopsy. IMPRESSION: Appropriate positioning of the X shaped biopsy marking clip at the site of biopsy in the upper-inner RIGHT breast. Final Assessment: Post Procedure Mammograms for Marker Placement Electronically Signed   By: Aliene Lloyd M.D.   On: 01/23/2024 10:45   MM 3D DIAGNOSTIC MAMMOGRAM BILATERAL BREAST Result Date: 01/19/2024 CLINICAL DATA:  Two year follow-up of probably benign RIGHT breast calcifications which were biopsied with stereotactic guidance on 12/17/2021. Pathology revealed benign concordant stromal fibrosis, focal benign adenosis and calcifications. Annual LEFT mammogram. EXAM: DIGITAL DIAGNOSTIC BILATERAL MAMMOGRAM WITH TOMOSYNTHESIS AND CAD TECHNIQUE: Bilateral digital diagnostic mammography and breast tomosynthesis was performed. The images were evaluated with computer-aided detection. COMPARISON:  Previous exam(s). ACR Breast Density Category d: The breasts are extremely dense, which lowers the sensitivity of mammography. FINDINGS: RIGHT: Mammogram: Previously biopsied calcifications (coil clip) in the central RIGHT breast are unchanged. Within the upper breast at middle depth, there is a 7 mm group of round and linear calcifications, some of which are in a branching pattern. No additional suspicious abnormalities of the RIGHT breast. LEFT: Mammogram: No suspicious mass, distortion, or microcalcifications are identified to suggest presence of malignancy. IMPRESSION: 1. Indeterminate 7 mm group of round and linear branching calcifications of the upper inner RIGHT breast. Further  evaluation with stereotactic guided biopsy recommended. 2. Previously biopsied RIGHT central breast calcifications (coil clip) demonstrate 2 year stability, consistent with benign etiology. 3. No evidence of LEFT breast malignancy. RECOMMENDATION: Stereotactic guided biopsy of 7 mm group of RIGHT upper inner breast calcifications. I have discussed the findings and recommendations with the patient. The biopsy procedure was explained to the patient and questions were answered. Patient expressed their understanding of the biopsy recommendation. Patient will be scheduled for biopsy at her earliest convenience by the schedulers. Ordering provider will be notified. If applicable, a reminder letter will be sent to the patient regarding the next appointment. BI-RADS CATEGORY  4: Suspicious. Electronically Signed   By: Aliene Lloyd M.D.   On: 01/19/2024 15:48     PATHOLOGY:    R Breast Biopsy 01/23/24: SURGICAL PATHOLOGY [facility] [address], [address] [place], [place] [address] Telephone [phone] or [phone] Fax [phone]  REPORT OF SURGICAL PATHOLOGY  Accession #: [id number] Patient Name: ARRYANNA, HOLQUIN Visit # : [id number]  MRN: [id number] Physician: Lloyd Aliene DOB/Age April 11, 1971 (Age: 72) Gender: F Collected Date: 01/23/2024 Received Date: 01/23/2024  FINAL DIAGNOSIS       1. Breast, right, needle core biopsy, calcifications, upper inner quadrant. (clip  x) :      DUCTAL CARCINOMA IN SITU, CRIBRIFORM AND COMEDO, INTERMEDIATE NUCLEAR GRADE      NECROSIS: PRESENT      CALCIFICATIONS: PRESENT      DCIS LENGTH: 0.4 CM  Results:  IMMUNOHISTOCHEMICAL AND MORPHOMETRIC ANALYSIS PERFORMED MANUALLY  Estrogen Receptor:  95%, POSITIVE, MODERATE-STRONG STAINING INTENSITY  Progesterone Receptor:  99%, POSITIVE, STRONG STAINING INTENSITY     IMPRESSION/PLAN:   Patient with Stage 0 cTis ductal carcinoma in situ, intermediate grade, ER/PR+ who is seen  pre-operatively  for consideration of adjuvant radiation therapy. We discussed the role of breast irradiation in reducing the risk of local recurrence and improving long-term disease control.  We reviewed the logistics of treatment in detail, including the need for a CT simulation for treatment planning, followed by several days for contouring, dosimetry, and quality assurance prior to starting therapy. Anticipated course of treatment will involve daily sessions, Monday through Friday, over approximately 3 weeks. Each session will last only a few minutes although set up time is longer such that she should expect to be in the department around 45 minutes.  She will see me once a week to ensure she is safe to continue radiation and that we can manage any side effects she may be experiencing.  We discussed acute and subacute side effects which are generally gradual in onset and typically include fatigue skin erythema, tanning or hyperpigmentation, breast edema or firmness, mild tenderness.  Less common but possible side effects include desquamation of the skin, decreased range of motion of this shoulder, and transient changes in breast size texture.  Long-term risk such as cosmetic changes, rare risk of rib fracture, cardiopulmonary effects and very rare secondary malignancies were also reviewed.  Reassured patient that most acute side effects improve over weeks to months after completing therapy.    Patient verbalized understanding of these risks and benefits and she is in agreement with the plan. I will see her in follow-up after surgery. Final treatment planning will depend on her postoperative pathology report, but we anticipate adjuvant whole breast radiation. Do not anticipate the need to radiate her axilla or any nodal regions.   --  Total time spent today in preparation for this visit was *** minutes. This included patient care, imaging and path review, documentation, multidisciplinary discussion and  coordination of care and follow up.    Estefana HERO. Maritza, M.D.

## 2024-02-01 ENCOUNTER — Inpatient Hospital Stay: Attending: Hematology and Oncology

## 2024-02-01 ENCOUNTER — Inpatient Hospital Stay (HOSPITAL_BASED_OUTPATIENT_CLINIC_OR_DEPARTMENT_OTHER): Admitting: Hematology and Oncology

## 2024-02-01 ENCOUNTER — Other Ambulatory Visit: Payer: Self-pay | Admitting: Surgery

## 2024-02-01 ENCOUNTER — Ambulatory Visit: Admitting: Physical Therapy

## 2024-02-01 ENCOUNTER — Encounter: Payer: Self-pay | Admitting: General Practice

## 2024-02-01 ENCOUNTER — Encounter: Payer: Self-pay | Admitting: *Deleted

## 2024-02-01 ENCOUNTER — Ambulatory Visit (HOSPITAL_BASED_OUTPATIENT_CLINIC_OR_DEPARTMENT_OTHER): Admitting: Obstetrics & Gynecology

## 2024-02-01 ENCOUNTER — Inpatient Hospital Stay (HOSPITAL_BASED_OUTPATIENT_CLINIC_OR_DEPARTMENT_OTHER)

## 2024-02-01 ENCOUNTER — Ambulatory Visit
Admission: RE | Admit: 2024-02-01 | Discharge: 2024-02-01 | Disposition: A | Discharge status: Home / Self Care | Source: Ambulatory Visit | Attending: Radiation Oncology | Admitting: Radiation Oncology

## 2024-02-01 VITALS — BP 120/78 | HR 69 | Temp 98.1°F | Resp 18 | Ht 67.25 in | Wt 127.8 lb

## 2024-02-01 DIAGNOSIS — D0511 Intraductal carcinoma in situ of right breast: Secondary | ICD-10-CM | POA: Insufficient documentation

## 2024-02-01 DIAGNOSIS — Z170 Estrogen receptor positive status [ER+]: Secondary | ICD-10-CM

## 2024-02-01 DIAGNOSIS — Z809 Family history of malignant neoplasm, unspecified: Secondary | ICD-10-CM | POA: Insufficient documentation

## 2024-02-01 DIAGNOSIS — Z1721 Progesterone receptor positive status: Secondary | ICD-10-CM | POA: Diagnosis not present

## 2024-02-01 DIAGNOSIS — Z79899 Other long term (current) drug therapy: Secondary | ICD-10-CM | POA: Insufficient documentation

## 2024-02-01 LAB — CBC WITH DIFFERENTIAL (CANCER CENTER ONLY)
Abs Immature Granulocytes: 0.01 K/uL (ref 0.00–0.07)
Basophils Absolute: 0.1 K/uL (ref 0.0–0.1)
Basophils Relative: 1 %
Eosinophils Absolute: 0.2 K/uL (ref 0.0–0.5)
Eosinophils Relative: 3 %
HCT: 38.8 % (ref 36.0–46.0)
Hemoglobin: 13.4 g/dL (ref 12.0–15.0)
Immature Granulocytes: 0 %
Lymphocytes Relative: 38 %
Lymphs Abs: 1.8 K/uL (ref 0.7–4.0)
MCH: 31.8 pg (ref 26.0–34.0)
MCHC: 34.5 g/dL (ref 30.0–36.0)
MCV: 91.9 fL (ref 80.0–100.0)
Monocytes Absolute: 0.4 K/uL (ref 0.1–1.0)
Monocytes Relative: 9 %
Neutro Abs: 2.3 K/uL (ref 1.7–7.7)
Neutrophils Relative %: 49 %
Platelet Count: 194 K/uL (ref 150–400)
RBC: 4.22 MIL/uL (ref 3.87–5.11)
RDW: 12.5 % (ref 11.5–15.5)
WBC Count: 4.8 K/uL (ref 4.0–10.5)
nRBC: 0 % (ref 0.0–0.2)

## 2024-02-01 LAB — CMP (CANCER CENTER ONLY)
ALT: 10 U/L (ref 0–44)
AST: 15 U/L (ref 15–41)
Albumin: 4.5 g/dL (ref 3.5–5.0)
Alkaline Phosphatase: 30 U/L — ABNORMAL LOW (ref 38–126)
Anion gap: 5 (ref 5–15)
BUN: 19 mg/dL (ref 6–20)
CO2: 32 mmol/L (ref 22–32)
Calcium: 9.8 mg/dL (ref 8.9–10.3)
Chloride: 104 mmol/L (ref 98–111)
Creatinine: 0.87 mg/dL (ref 0.44–1.00)
GFR, Estimated: >60 mL/min (ref >60)
Glucose, Bld: 76 mg/dL (ref 70–99)
Potassium: 4.2 mmol/L (ref 3.5–5.1)
Sodium: 141 mmol/L (ref 135–145)
Total Bilirubin: 0.6 mg/dL (ref 0.0–1.2)
Total Protein: 7.2 g/dL (ref 6.5–8.1)

## 2024-02-01 LAB — GENETIC SCREENING ORDER

## 2024-02-01 NOTE — Progress Notes (Signed)
 [facility] Multidisciplinary Clinic Spiritual Care Note  Met with Sandra Juarez and her husband Sandra Juarez in Breast Multidisciplinary Clinic to introduce Support Center team/resources.  She completed SDOH screening; results follow below.    SDOH Screenings   Food Insecurity: No Food Insecurity (02/01/2024)  Housing: Low Risk  (02/01/2024)  Transportation Needs: No Transportation Needs (02/01/2024)  Utilities: Not At Risk (02/01/2024)  Depression (PHQ2-9): Low Risk  (02/01/2024)  Tobacco Use: Low Risk  (02/01/2024)   Chaplain and patient discussed common feelings and emotions when being diagnosed with cancer, and the importance of support during treatment.  Chaplain informed patient of the support team and support services at [facility].  Chaplain provided contact information and encouraged patient to call with any questions or concerns.  Ms Cronin reports feeling better after [facility]. Consulted about several concerns, including how to talk with/support their children (first- and second-year college students) regarding her diagnosis and treatment, and tools for communicating/setting boundaries with well-meaning supporters.  Follow up needed: Yes.  We plan to follow up by phone in ca two weeks for Spiritual Care check-in.   [address] [place], [place], [facility] Pager [phone] Voicemail [phone]

## 2024-02-01 NOTE — Progress Notes (Addendum)
 REFERRING PROVIDER: Vernetta Berg, MD [address] [place],  [place] [address]  PRIMARY PROVIDER:  Shepard Ade, MD  PRIMARY REASON FOR VISIT:  1. Ductal carcinoma in situ (DCIS) of right breast    HISTORY OF PRESENT ILLNESS:   Sandra Juarez, a 53 y.o. female, was seen for a [facility] Juarez genetics consultation at the request of Berg Vernetta, MD due to a personal history of breast Juarez. Sandra Juarez presents today the at [facility] to discuss the possibility of a hereditary predisposition to Juarez, genetic testing, and to further clarify her future Juarez risks, as well as potential Juarez risks for family members.  Diagnosis: In October 2025, at the age of 8, Sandra Juarez was diagnosed with ductal carcinoma in situ (DCIS) of the right breast, ER+, PR+.   Juarez HISTORY:  Oncology History  Ductal carcinoma in situ (DCIS) of right breast  01/23/2024 Initial Diagnosis   2-year follow-up of calcifications (previous benign right breast biopsy): New linear branching calcifications spanning 7 mm stereotactic biopsy: Intermediate grade DCIS with necrosis and calcifications ER 95%, PR 99%   01/23/2024 Juarez Staging   Staging form: Breast, AJCC 8th Edition - Clinical stage from 01/23/2024: Stage 0 (cTis (DCIS), cN0, cM0, ER+, PR+, HER2: Not Assessed) - Signed by Sandra Potts, MD on 02/01/2024 Stage prefix: Initial diagnosis Nuclear grade: G2    RISK FACTORS:  First live birth at age 81.  OCP use for approximately 0 years.  Menopausal status: premenopausal.  Colonoscopy: no; Never had one. Mammogram within the last year: yes. Number of breast biopsies: 1. Up to date with pelvic exams: yes. Tobacco Use: Never Past Medical History:  Diagnosis Date   Abnormal Pap smear of cervix 2010   Anaphylactic reaction    x 2   (hospitalized)   Anxiety    Cervical disc disorder    Depression    Encounter for insertion of Mirena  IUD  03/2011, 03/2016   Laceration of right little finger 10/2016   Migraines    STD (sexually transmitted disease)    HPV    Past Surgical History:  Procedure Laterality Date   BREAST BIOPSY Right 01/23/2024   MM RT BREAST BX W LOC DEV 1ST LESION IMAGE BX SPEC STEREO GUIDE 01/23/2024 [facility]AMMO[facility]   CESAREAN SECTION  2004 ,2006   x 2   COLPOSCOPY  02/2009   Low-grade squamous intraepithelial lesion (LGSIL) on Bx   FINGER SURGERY Right 10/2016   Little finger    Social History   Socioeconomic History   Marital status: Married    Spouse name: Safia Panzer   Number of children: 2   Years of education: Masters   Highest education level: Not on file  Occupational History   Not on file  Tobacco Use   Smoking status: Never    Passive exposure: Never   Smokeless tobacco: Never  Vaping Use   Vaping status: Never Used  Substance and Sexual Activity   Alcohol use: Yes    Alcohol/week: 6.0 standard drinks of alcohol    Types: 3 Glasses of wine, 3 Shots of liquor per week   Drug use: No   Sexual activity: Yes    Partners: Male    Birth control/protection: I.U.D.    Comment: Mirena --inserted 04/09/16  Other Topics Concern   Not on file  Social History Narrative   Lives at home with husband and 2 children   Caffeine use: occasionally (2-3/week, includes coffee, diet coke)  Social Drivers of Corporate investment banker Strain: Not on file  Food Insecurity: No Food Insecurity (02/01/2024)   Hunger Vital Sign    Worried About Running Out of Food in the Last Year: Never true    Ran Out of Food in the Last Year: Never true  Transportation Needs: No Transportation Needs (02/01/2024)   PRAPARE - Administrator, Civil Service (Medical): No    Lack of Transportation (Non-Medical): No  Physical Activity: Not on file  Stress: Not on file  Social Connections: Not on file     FAMILY HISTORY:  We obtained a detailed, 4-generation family history pasted below.    Sandra Juarez is unaware of relatives completing genetic testing for hereditary Juarez risks.   Family history Family History  Problem Relation Age of Onset   Multiple sclerosis Mother    Juarez Paternal Grandfather 36       dec   Hypertension Brother    Diabetes Brother        AODM   Breast Juarez Neg Hx      GENETIC COUNSELING ASSESSMENT: Sandra Juarez is a 53 y.o. female with a personal history of breast. We, discussed and recommended the following at today's visit.   DISCUSSION: We discussed that, in general, most Juarez is not inherited in families, but instead is sporadic or familial. Sporadic cancers occur by chance and typically happen at older ages (>50 years) as this type of Juarez is caused by genetic changes acquired during an individual's lifetime. Some families have more cancers than would be expected by chance; however, the ages or types of Juarez are not consistent with a known genetic mutation or known genetic mutations have been ruled out. This type of familial Juarez is thought to be due to a combination of multiple genetic, environmental, hormonal, and lifestyle factors. While this combination of factors likely increases the risk of Juarez, the exact source of this risk is not currently identifiable or testable.  We discussed that 5-10% of Juarez is the result of germline (heritable) genetic variants, with most cases associated with BRCA1/BRCA2. There are other genes that Juarez be associated with hereditary  Juarez syndromes. We discussed that testing is beneficial for several reasons including knowing how to follow individuals after completing their treatment, identifying whether potential treatment options such as PARP inhibitors would be beneficial, and understanding if other family members could be at risk for Juarez and allow them to undergo genetic testing.   We reviewed the characteristics, features and inheritance patterns of hereditary Juarez syndromes. We also  discussed genetic testing, including the appropriate family members to test, the process of testing, insurance coverage and turn-around-time for results. We discussed the implications of a negative, positive, carrier and/or variant of uncertain significant result. Sandra Juarez  was offered a common hereditary Juarez panel (40 genes). Sandra Juarez was informed of the benefits and limitations of a panel, including that expanded Juarez panels contain genes that do not have clear management guidelines at this point in time.  We also discussed that as the number of genes included on a panel increases, the chances of variants of uncertain significance increases.  GENETIC TESTING NATIONAL CRITERIA: Based on Sandra Juarez personal history of breast Juarez Sandra does not meet medical criteria for genetic testing based on the [employer] (NCCN) guidelines. Sandra Juarez and we discussed that this makes us  less suspicious that Sandra has a genetic variant in a gene associated with  a hereditary Juarez syndrome.   We discussed that Sandra Juarez still pursue genetic testing without meeting national testing criteria. We discussed that if her out of pocket cost for testing is over $100, the laboratory text her with an out of pocket estimate and Sandra Juarez opt to proceed with testing, pay a self pay price, pursue financial assistance or cancel the testing.  If the out of pocket cost of testing is less than $100 Sandra will be billed by the genetic testing laboratory.   GENETIC TESTING CONSENT:  After considering the risks, benefits, and limitations, Sandra Juarez wish to take time to consider if the would like to pursue testing. I will contact Sandra Juarez on Friday (02/03/2024) to follow up.  GENETIC INFORMATION NONDISCRIMINATION ACT (GINA): We discussed that some people do not want to undergo genetic testing due to fear of genetic discrimination.  The Genetic Information Nondiscrimination Act (GINA)  was signed into federal law in 2008. GINA prohibits health insurers and most employers from discriminating against individuals based on genetic information (including the results of genetic tests and family history information). According to GINA, health insurance companies cannot consider genetic information to be a preexisting condition, nor Juarez they use it to make decisions regarding coverage or rates. GINA also makes it illegal for most employers to use genetic information in making decisions about hiring, firing, promotion, or terms of employment. It is important to note that GINA does not offer protections for life insurance, disability insurance, or long-term care insurance. GINA does not apply to those in the [employer], those who work for companies with less than 15 employees, and new life insurance or long-term disability insurance policies.  Health status due to a Juarez diagnosis is not protected under GINA. More information about GINA Juarez be found by visiting [email or web address].  Lastly, we encouraged Schelly Chuba to remain in contact with Juarez genetics annually so that we Juarez continuously update the family history and inform her of any changes in Juarez genetics and testing that may be of benefit for this family.   Shaquasha Brisky's questions were answered to her satisfaction today. Our contact information was provided should additional questions or concerns arise. Thank you for the referral and allowing us  to share in the care of your patient.   Resources:  Vandora Jaskulski was provided with the following:  [facility] Billing information  [employer] Hereditary Juarez Testing Patient Guide Engineer, manufacturing Patient Assistance Information Sheet  PLAN:  Testing Ordered: NONE Clinic Note Faxed/Routed to Sandra Juarez PCP Shepard Ade, MD   Santana Fryer, MS, [facility]  Certified Genetic Counselor  Email: [email or web address]  Phone: [phone]  I personally spent a  total of 20 minutes in the care of the patient today including preparing to see the patient, getting/reviewing separately obtained history, counseling and educating, and documenting clinical information in the EHR. The patient brought her husband Alejos). Drs. Lanny Stalls, and/or Gudena were available for questions, if needed. _______________________________________________________________________ For Office Staff:  Number of people involved in session: 2 Was an Intern/ student involved with case: no

## 2024-02-01 NOTE — Progress Notes (Signed)
 [facility] CONSULT NOTE  Patient Care Team: Shepard Ade, MD as PCP - General (Internal Medicine) Gerome, Devere HERO, RN as Oncology Nurse Navigator Tyree Nanetta SAILOR, RN as Oncology Nurse Navigator Vernetta Berg, MD as Consulting Physician (General Surgery) Odean Potts, MD as Consulting Physician (Hematology and Oncology) Maritza Stagger, MD as Consulting Physician (Radiation Oncology)  CHIEF COMPLAINTS/PURPOSE OF CONSULTATION:  Newly diagnosed right breast DCIS  HISTORY OF PRESENTING ILLNESS:   History of Present Illness Sandra Juarez is a 53 year old female with ductal carcinoma in situ (DCIS) who presents for follow-up after biopsy results. She is accompanied by her son, [name]. She was referred by Dr. Maritza for evaluation of right breast DCIS.  She has a 7 millimeter grade 2 ductal carcinoma in situ with estrogen and progesterone receptor positivity at 95% and 99%, respectively. Imaging studies showed little change, leading to the biopsy. She is concerned about the potential for other cancers but has no family history or known genetic predisposition.  I reviewed her records extensively and collaborated the history with the patient.  SUMMARY OF ONCOLOGIC HISTORY: Oncology History  Ductal carcinoma in situ (DCIS) of right breast  01/23/2024 Initial Diagnosis   2-year follow-up of calcifications (previous benign right breast biopsy): New linear branching calcifications spanning 7 mm stereotactic biopsy: Intermediate grade DCIS with necrosis and calcifications ER 95%, PR 99%   01/23/2024 Cancer Staging   Staging form: Breast, AJCC 8th Edition - Clinical stage from 01/23/2024: Stage 0 (cTis (DCIS), cN0, cM0, ER+, PR+, HER2: Not Assessed) - Signed by Odean Potts, MD on 02/01/2024 Stage prefix: Initial diagnosis Nuclear grade: G2      MEDICAL HISTORY:  Past Medical History:  Diagnosis Date   Abnormal Pap smear of cervix 2010   Anaphylactic  reaction    x 2   (hospitalized)   Anxiety    Cervical disc disorder    Depression    Encounter for insertion of Mirena  IUD 03/2011, 03/2016   Laceration of right little finger 10/2016   Migraines    STD (sexually transmitted disease)    HPV    SURGICAL HISTORY: Past Surgical History:  Procedure Laterality Date   BREAST BIOPSY Right 01/23/2024   MM RT BREAST BX W LOC DEV 1ST LESION IMAGE BX SPEC STEREO GUIDE 01/23/2024 [facility] MAMMO[facility]AP[facility]   CESAREAN SECTION  2004 ,2006   x 2   COLPOSCOPY  02/2009   Low-grade squamous intraepithelial lesion (LGSIL) on Bx   FINGER SURGERY Right 10/2016   Little finger    SOCIAL HISTORY: Social History   Socioeconomic History   Marital status: Married    Spouse name: Anapaula Severt   Number of children: 2   Years of education: Masters   Highest education level: Not on file  Occupational History   Not on file  Tobacco Use   Smoking status: Never    Passive exposure: Never   Smokeless tobacco: Never  Vaping Use   Vaping status: Never Used  Substance and Sexual Activity   Alcohol use: Yes    Alcohol/week: 6.0 standard drinks of alcohol    Types: 3 Glasses of wine, 3 Shots of liquor per week   Drug use: No   Sexual activity: Yes    Partners: Male    Birth control/protection: I.U.D.    Comment: Mirena --inserted 04/09/16  Other Topics Concern   Not on file  Social History Narrative   Lives at home with husband and 2 children   Caffeine  use: occasionally (2-3/week, includes coffee, diet coke)    Social Drivers of Corporate investment banker Strain: Not on file  Food Insecurity: No Food Insecurity (02/01/2024)   Hunger Vital Sign    Worried About Running Out of Food in the Last Year: Never true    Ran Out of Food in the Last Year: Never true  Transportation Needs: No Transportation Needs (02/01/2024)   PRAPARE - Administrator, Civil Service (Medical): No    Lack of Transportation (Non-Medical): No  Physical Activity:  Not on file  Stress: Not on file  Social Connections: Not on file  Intimate Partner Violence: Not At Risk (02/01/2024)   Humiliation, Afraid, Rape, and Kick questionnaire    Fear of Current or Ex-Partner: No    Emotionally Abused: No    Physically Abused: No    Sexually Abused: No    FAMILY HISTORY: Family History  Problem Relation Age of Onset   Multiple sclerosis Mother    Cancer Paternal Grandfather 8       dec   Hypertension Brother    Diabetes Brother        AODM   Breast cancer Neg Hx     ALLERGIES:  is allergic to lidocaine and trazodone .  MEDICATIONS:  Current Outpatient Medications  Medication Sig Dispense Refill   citalopram (CELEXA) 20 MG tablet Take 20 mg by mouth daily.  3   EPINEPHrine  0.3 mg/0.3 mL IJ SOAJ injection Inject 0.3 mLs (0.3 mg total) into the muscle as needed for anaphylaxis. 1 each 1   rizatriptan (MAXALT-MLT) 10 MG disintegrating tablet Take 1 tablet by mouth daily as needed.     zolpidem (AMBIEN) 10 MG tablet Take 5-10 mg by mouth at bedtime as needed.     No current facility-administered medications for this visit.    REVIEW OF SYSTEMS:   Constitutional: Denies fevers, chills or abnormal night sweats All other systems were reviewed with the patient and are negative.  PHYSICAL EXAMINATION: ECOG PERFORMANCE STATUS: 0 - Asymptomatic  Vitals:   02/01/24 1243  BP: 120/78  Pulse: 69  Resp: 18  Temp: 98.1 F (36.7 C)  SpO2: 100%   Filed Weights   02/01/24 1243  Weight: 127 lb 12.8 oz (58 kg)    GENERAL:alert, no distress and comfortable    LABORATORY DATA:  I have reviewed the data as listed Lab Results  Component Value Date   WBC 4.8 02/01/2024   HGB 13.4 02/01/2024   HCT 38.8 02/01/2024   MCV 91.9 02/01/2024   PLT 194 02/01/2024   Lab Results  Component Value Date   NA 141 02/01/2024   K 4.2 02/01/2024   CL 104 02/01/2024   CO2 32 02/01/2024    RADIOGRAPHIC STUDIES: I have personally reviewed the radiological  reports and agreed with the findings in the report.  ASSESSMENT AND PLAN:  Ductal carcinoma in situ (DCIS) of right breast 01/23/2024:2-year follow-up of calcifications (previous benign right breast biopsy): New linear branching calcifications spanning 7 mm stereotactic biopsy: Intermediate grade DCIS with necrosis and calcifications ER 95%, PR 99%  Pathology review: I discussed with the patient the difference between DCIS and invasive breast cancer. It is considered a precancerous lesion. DCIS is classified as a 0. It is generally detected through mammograms as calcifications. We discussed the significance of grades and its impact on prognosis. We also discussed the importance of ER and PR receptors and their implications to adjuvant treatment options. Prognosis of DCIS  dependence on grade, comedo necrosis. It is anticipated that if not treated, 20-30% of DCIS can develop into invasive breast cancer.  Recommendation: 1. Breast conserving surgery 2. Followed by adjuvant radiation therapy 3. Followed by antiestrogen therapy with tamoxifen 5 years (discussed TAM 01 clinical trial and the use of low-dose tamoxifen) Genetic testing  Tamoxifen counseling: We discussed the risks and benefits of tamoxifen. These include but not limited to insomnia, hot flashes, mood changes, vaginal dryness, and weight gain. Although rare, serious side effects including endometrial cancer, risk of blood clots were also discussed. We strongly believe that the benefits far outweigh the risks. Patient understands these risks and consented to starting treatment. Planned treatment duration is 5 years.  Return to clinic after surgery to discuss the final pathology report and come up with an adjuvant treatment plan.      All questions were answered. The patient knows to call the clinic with any problems, questions or concerns. I personally spent a total of 30 minutes in the care of the patient today including preparing to  see the patient, getting/reviewing separately obtained history, performing a medically appropriate exam/evaluation, counseling and educating, placing orders, referring and communicating with other health care professionals, documenting clinical information in the EHR, independently interpreting results, communicating results, and coordinating care.   Viinay K Bruk Tumolo, MD 02/01/24

## 2024-02-01 NOTE — Assessment & Plan Note (Signed)
 01/23/2024:2-year follow-up of calcifications (previous benign right breast biopsy): New linear branching calcifications spanning 7 mm stereotactic biopsy: Intermediate grade DCIS with necrosis and calcifications ER 95%, PR 99%  Pathology review: I discussed with the patient the difference between DCIS and invasive breast cancer. It is considered a precancerous lesion. DCIS is classified as a 0. It is generally detected through mammograms as calcifications. We discussed the significance of grades and its impact on prognosis. We also discussed the importance of ER and PR receptors and their implications to adjuvant treatment options. Prognosis of DCIS dependence on grade, comedo necrosis. It is anticipated that if not treated, 20-30% of DCIS can develop into invasive breast cancer.  Recommendation: 1. Breast conserving surgery 2. Followed by adjuvant radiation therapy 3. Followed by antiestrogen therapy with tamoxifen 5 years (discussed TAM 01 clinical trial and the use of low-dose tamoxifen)  Tamoxifen counseling: We discussed the risks and benefits of tamoxifen. These include but not limited to insomnia, hot flashes, mood changes, vaginal dryness, and weight gain. Although rare, serious side effects including endometrial cancer, risk of blood clots were also discussed. We strongly believe that the benefits far outweigh the risks. Patient understands these risks and consented to starting treatment. Planned treatment duration is 5 years.  Return to clinic after surgery to discuss the final pathology report and come up with an adjuvant treatment plan.

## 2024-02-03 ENCOUNTER — Other Ambulatory Visit: Payer: Self-pay | Admitting: Surgery

## 2024-02-03 ENCOUNTER — Encounter (HOSPITAL_BASED_OUTPATIENT_CLINIC_OR_DEPARTMENT_OTHER): Payer: Self-pay | Admitting: Surgery

## 2024-02-03 ENCOUNTER — Other Ambulatory Visit: Payer: Self-pay

## 2024-02-03 DIAGNOSIS — D0511 Intraductal carcinoma in situ of right breast: Secondary | ICD-10-CM

## 2024-02-03 NOTE — Progress Notes (Signed)
 I spoke to [place] and she requested to move forward with the CancerNext Panel.  [facility] CancerNext + RNAinsight gene panel which includes sequencing, rearrangement analysis, and RNA analysis for the following 40 genes: APC, ATM, BAP1, BARD1, BMPR1A, BRCA1, BRCA2, BRIP1, CDH1, CDKN2A, CHEK2, FH, FLCN, MET, MLH1, MSH2, MSH6, MUTYH, NF1, NTHL1, PALB2, PMS2, PTEN, RAD51C, RAD51D, RPS20, SMAD4, STK11, TP53, TSC1, TSC2, and VHL (sequencing and deletion/duplication); AXIN2, HOXB13, MBD4, MSH3, POLD1 and POLE (sequencing only); EPCAM and GREM1 (deletion/duplication only).   The order will be placed in the [facility] portal.  Santana Fryer, MS, CGC  Certified Genetic Counselor  Email: [email or web address]  Phone: [phone]

## 2024-02-09 MED ORDER — CHLORHEXIDINE GLUCONATE CLOTH 2 % EX PADS: 6.0000 | MEDICATED_PAD | Freq: Once | CUTANEOUS | Status: DC

## 2024-02-09 MED ORDER — ENSURE PRE-SURGERY PO LIQD: 296.0000 mL | Freq: Once | ORAL | Status: DC

## 2024-02-09 NOTE — Progress Notes (Signed)

## 2024-02-10 ENCOUNTER — Ambulatory Visit
Admission: RE | Admit: 2024-02-10 | Discharge: 2024-02-10 | Disposition: A | Discharge status: Home / Self Care | Source: Ambulatory Visit | Attending: Surgery | Admitting: Surgery

## 2024-02-10 ENCOUNTER — Other Ambulatory Visit: Payer: Self-pay | Admitting: Surgery

## 2024-02-10 ENCOUNTER — Encounter: Payer: Self-pay | Admitting: *Deleted

## 2024-02-10 ENCOUNTER — Telehealth: Payer: Self-pay | Admitting: *Deleted

## 2024-02-10 DIAGNOSIS — D0511 Intraductal carcinoma in situ of right breast: Secondary | ICD-10-CM

## 2024-02-10 HISTORY — PX: BREAST BIOPSY: SHX20

## 2024-02-10 NOTE — Telephone Encounter (Signed)
 Spoke to patient for a [facility] f/u call. Discussed upcoming surgery and plan of care. Aware to call navigator with any problems or questions.

## 2024-02-11 NOTE — H&P (Signed)
 REFERRING PHYSICIAN: Gudena, Vinay K, MD PROVIDER: VICENTA DASIE POLI, MD MRN: [id number] DOB: [birth year] DATE OF ENCOUNTER: 02/01/2024 Subjective   Chief Complaint: NEW BREAST CLINIC  History of Present Illness: Sandra Juarez is a 53 y.o. female who is seen today as an office consultation for evaluation of NEW BREAST CLINIC  This is a pleasant 53 year old female who is brought back to the breast center for 2-year follow-up imagings of a benign finding. She was found to have a linear array of calcifications in the upper right breast measuring 7 mm. 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 It was 95% ER positive and 99% PR positive. She has had no previous problems regarding her breast other than benign biopsy in the past. History is negative for breast cancer. She is otherwise healthy without complaints. She has no cardiopulmonary issues  Review of Systems: A complete review of systems was obtained from the patient. I have reviewed this information and discussed as appropriate with the patient. See HPI as well for other ROS.  ROS   Medical History: History reviewed. No pertinent past medical history.  There is no problem list on file for this patient.  Past Surgical History:  Procedure Laterality Date  BREAST EXCISIONAL BIOPSY  CESAREAN SECTION    Allergies  Allergen Reactions  Lidocaine Other (See Comments)   Current Outpatient Medications on File Prior to Visit  Medication Sig Dispense Refill  citalopram (CELEXA) 20 MG tablet Take 20 mg by mouth once daily  EPINEPHrine  (EPIPEN ) 0.3 mg/0.3 mL auto-injector Inject 0.3 mg into the muscle as needed for Anaphylaxis  levonorgestreL  (MIRENA  52 MG) IUD Insert 1 each into the uterus once  rizatriptan (MAXALT-MLT) 10 MG disintegrating tablet Take 10 mg by mouth as needed  zolpidem (AMBIEN) 10 mg tablet TAKE 1/2 TO 1 TABLET BY MOUTH ONCE NIGHTLY AS NEEDED   No current facility-administered medications  on file prior to visit.   Family History  Problem Relation Age of Onset  Skin cancer Brother  Diabetes Brother    Social History   Tobacco Use  Smoking Status Never  Smokeless Tobacco Never    Social History   Socioeconomic History  Marital status: Married  Tobacco Use  Smoking status: Never  Smokeless tobacco: Never  Substance and Sexual Activity  Alcohol use: Yes  Alcohol/week: 4.0 - 14.0 standard drinks of alcohol  Types: 4 - 14 Standard drinks or equivalent per week  Drug use: Never   Objective:  There were no vitals filed for this visit.  There is no height or weight on file to calculate BMI.  Physical Exam   She appears well on exam.  On breast exam, there is minimal ecchymosis from her biopsy of the right breast. There are no palpable masses. The nipple areolar complex is normal in appearance.  There is no axillary adenopathy  Labs, Imaging and Diagnostic Testing: I have reviewed her mammograms, ultrasound, and pathology results  Assessment and Plan:   Diagnoses and all orders for this visit:  Neoplasm of right breast, primary tumor staging category Tis: ductal carcinoma in situ (DCIS)   We have discussed the diagnosis in our multidisciplinary breast cancer conference. I discussed the diagnosis with the patient and her husband. From a surgical standpoint we then discussed treatment for DCIS. We discussed breast conservation versus mastectomy. As this is quite small, she proceed with breast conservation. I next discussed proceeding with a radioactive seed guided right breast lumpectomy. I explained the surgical procedure  in detail. We discussed the risks which include but is not limited to bleeding, infection, the need for further surgery if margins are positive, cardiopulmonary issues with anesthesia, blood clots, postoperative recovery, excetra. After thorough discussion she and her husband agree with the plans with proceeding with surgery as outlined.  Surgery will be scheduled to soon as possible  Addendum: She underwent placement of 2 radioactive seeds by radiology as the clip appears to have migrated from the biopsy site and this will also allow bracketing of the area of calcifications

## 2024-02-13 ENCOUNTER — Ambulatory Visit (HOSPITAL_BASED_OUTPATIENT_CLINIC_OR_DEPARTMENT_OTHER)
Admission: RE | Admit: 2024-02-13 | Discharge: 2024-02-13 | Disposition: A | Discharge status: Home / Self Care | Attending: Surgery | Admitting: Surgery

## 2024-02-13 ENCOUNTER — Other Ambulatory Visit: Payer: Self-pay

## 2024-02-13 ENCOUNTER — Encounter (HOSPITAL_BASED_OUTPATIENT_CLINIC_OR_DEPARTMENT_OTHER): Payer: Self-pay | Admitting: Surgery

## 2024-02-13 ENCOUNTER — Ambulatory Visit (HOSPITAL_BASED_OUTPATIENT_CLINIC_OR_DEPARTMENT_OTHER): Payer: Self-pay | Admitting: Anesthesiology

## 2024-02-13 ENCOUNTER — Ambulatory Visit
Admission: RE | Admit: 2024-02-13 | Discharge: 2024-02-13 | Disposition: A | Discharge status: Home / Self Care | Source: Ambulatory Visit | Attending: Surgery | Admitting: Surgery

## 2024-02-13 ENCOUNTER — Encounter (HOSPITAL_BASED_OUTPATIENT_CLINIC_OR_DEPARTMENT_OTHER)
Admission: RE | Disposition: A | Discharge status: Home / Self Care | Payer: Self-pay | Source: Home / Self Care | Attending: Surgery

## 2024-02-13 ENCOUNTER — Ambulatory Visit
Admission: RE | Admit: 2024-02-13 | Discharge: 2024-02-13 | Disposition: A | Source: Ambulatory Visit | Attending: Surgery | Admitting: Surgery

## 2024-02-13 DIAGNOSIS — Z1721 Progesterone receptor positive status: Secondary | ICD-10-CM | POA: Diagnosis not present

## 2024-02-13 DIAGNOSIS — D0511 Intraductal carcinoma in situ of right breast: Secondary | ICD-10-CM

## 2024-02-13 DIAGNOSIS — Z170 Estrogen receptor positive status [ER+]: Secondary | ICD-10-CM | POA: Insufficient documentation

## 2024-02-13 DIAGNOSIS — Z01818 Encounter for other preprocedural examination: Secondary | ICD-10-CM

## 2024-02-13 HISTORY — PX: BREAST LUMPECTOMY WITH RADIOACTIVE SEED LOCALIZATION: SHX6424

## 2024-02-13 LAB — POCT PREGNANCY, URINE: Preg Test, Ur: NEGATIVE

## 2024-02-13 SURGERY — BREAST LUMPECTOMY WITH RADIOACTIVE SEED LOCALIZATION
Anesthesia: General | Site: Breast | Laterality: Right

## 2024-02-13 MED ORDER — ACETAMINOPHEN 500 MG PO TABS: 1000.0000 mg | ORAL_TABLET | ORAL | Status: AC

## 2024-02-13 MED ORDER — ACETAMINOPHEN 500 MG PO TABS
1000.0000 mg | ORAL_TABLET | Freq: Once | ORAL | Status: AC
  Administered 2024-02-13: 1000 mg via ORAL

## 2024-02-13 MED ORDER — TRAMADOL HCL 50 MG PO TABS: 50.0000 mg | ORAL_TABLET | Freq: Four times a day (QID) | ORAL | 0 refills | Status: AC | PRN

## 2024-02-13 MED ORDER — EPHEDRINE 5 MG/ML INJ
INTRAVENOUS | Status: AC
  Filled 2024-02-13: qty 5

## 2024-02-13 MED ORDER — FENTANYL CITRATE (PF) 100 MCG/2ML IJ SOLN: 25.0000 ug | INTRAMUSCULAR | Status: DC | PRN

## 2024-02-13 MED ORDER — PROPOFOL 10 MG/ML IV BOLUS
INTRAVENOUS | Status: AC
  Filled 2024-02-13: qty 20

## 2024-02-13 MED ORDER — ONDANSETRON HCL 4 MG/2ML IJ SOLN
INTRAMUSCULAR | Status: AC
  Filled 2024-02-13: qty 2

## 2024-02-13 MED ORDER — MIDAZOLAM HCL (PF) 2 MG/2ML IJ SOLN
INTRAMUSCULAR | Status: DC | PRN
  Administered 2024-02-13: 2 mg via INTRAVENOUS

## 2024-02-13 MED ORDER — ONDANSETRON HCL 4 MG/2ML IJ SOLN
INTRAMUSCULAR | Status: DC | PRN
  Administered 2024-02-13: 4 mg via INTRAVENOUS

## 2024-02-13 MED ORDER — OXYCODONE HCL 5 MG PO TABS
ORAL_TABLET | ORAL | Status: AC
  Filled 2024-02-13: qty 1

## 2024-02-13 MED ORDER — PROPOFOL 10 MG/ML IV BOLUS
INTRAVENOUS | Status: DC | PRN
  Administered 2024-02-13: 150 mg via INTRAVENOUS
  Administered 2024-02-13: 50 mg via INTRAVENOUS

## 2024-02-13 MED ORDER — DROPERIDOL 2.5 MG/ML IJ SOLN: 0.6250 mg | Freq: Once | INTRAMUSCULAR | Status: DC | PRN

## 2024-02-13 MED ORDER — FENTANYL CITRATE (PF) 250 MCG/5ML IJ SOLN
INTRAMUSCULAR | Status: DC | PRN
Start: 2024-02-13 — End: 2024-02-13
  Administered 2024-02-13: 100 ug via INTRAVENOUS

## 2024-02-13 MED ORDER — FENTANYL CITRATE (PF) 100 MCG/2ML IJ SOLN
INTRAMUSCULAR | Status: AC
  Filled 2024-02-13: qty 2

## 2024-02-13 MED ORDER — LACTATED RINGERS IV SOLN: INTRAVENOUS | Status: DC

## 2024-02-13 MED ORDER — OXYCODONE HCL 5 MG PO TABS
5.0000 mg | ORAL_TABLET | Freq: Once | ORAL | Status: AC | PRN
  Administered 2024-02-13: 5 mg via ORAL

## 2024-02-13 MED ORDER — CEFAZOLIN SODIUM-DEXTROSE 2-4 GM/100ML-% IV SOLN
INTRAVENOUS | Status: AC
  Filled 2024-02-13: qty 100

## 2024-02-13 MED ORDER — ACETAMINOPHEN 500 MG PO TABS
ORAL_TABLET | ORAL | Status: AC
  Filled 2024-02-13: qty 2

## 2024-02-13 MED ORDER — MIDAZOLAM HCL 2 MG/2ML IJ SOLN
INTRAMUSCULAR | Status: AC
Start: 2024-02-13 — End: 2024-02-13
  Filled 2024-02-13: qty 2

## 2024-02-13 MED ORDER — DEXAMETHASONE SOD PHOSPHATE PF 10 MG/ML IJ SOLN
INTRAMUSCULAR | Status: DC | PRN
  Administered 2024-02-13: 10 mg via INTRAVENOUS

## 2024-02-13 MED ORDER — EPHEDRINE SULFATE-NACL 50-0.9 MG/10ML-% IV SOSY
PREFILLED_SYRINGE | INTRAVENOUS | Status: DC | PRN
  Administered 2024-02-13: 10 mg via INTRAVENOUS
  Administered 2024-02-13: 5 mg via INTRAVENOUS

## 2024-02-13 MED ORDER — BUPIVACAINE-EPINEPHRINE 0.5% -1:200000 IJ SOLN
INTRAMUSCULAR | Status: DC | PRN
  Administered 2024-02-13: 20 mL

## 2024-02-13 MED ORDER — 0.9 % SODIUM CHLORIDE (POUR BTL) OPTIME
TOPICAL | Status: DC | PRN
  Administered 2024-02-13: 1000 mL

## 2024-02-13 MED ORDER — CEFAZOLIN SODIUM-DEXTROSE 2-4 GM/100ML-% IV SOLN
2.0000 g | INTRAVENOUS | Status: AC
  Administered 2024-02-13: 2 g via INTRAVENOUS

## 2024-02-13 MED ORDER — OXYCODONE HCL 5 MG/5ML PO SOLN: 5.0000 mg | Freq: Once | ORAL | Status: AC | PRN

## 2024-02-13 SURGICAL SUPPLY — 38 items
BINDER BREAST 3XL (GAUZE/BANDAGES/DRESSINGS) IMPLANT
BINDER BREAST LRG (GAUZE/BANDAGES/DRESSINGS) IMPLANT
BINDER BREAST MEDIUM (GAUZE/BANDAGES/DRESSINGS) IMPLANT
BINDER BREAST XLRG (GAUZE/BANDAGES/DRESSINGS) IMPLANT
BINDER BREAST XXLRG (GAUZE/BANDAGES/DRESSINGS) IMPLANT
BLADE SURG 15 STRL LF DISP TIS (BLADE) ×1 IMPLANT
CANISTER SUC SOCK COL 7IN (MISCELLANEOUS) IMPLANT
CANISTER SUCT 1200ML W/VALVE (MISCELLANEOUS) IMPLANT
CHLORAPREP W/TINT 26 (MISCELLANEOUS) ×1 IMPLANT
CLIP APPLIE 9.375 MED OPEN (MISCELLANEOUS) IMPLANT
COVER BACK TABLE 60X90IN (DRAPES) ×1 IMPLANT
COVER MAYO STAND STRL (DRAPES) ×1 IMPLANT
COVER PROBE CYLINDRICAL 5X96 (MISCELLANEOUS) ×1 IMPLANT
DERMABOND ADVANCED .7 DNX12 (GAUZE/BANDAGES/DRESSINGS) ×1 IMPLANT
DRAPE LAPAROSCOPIC ABDOMINAL (DRAPES) ×1 IMPLANT
DRAPE UTILITY XL STRL (DRAPES) ×1 IMPLANT
ELECTRODE REM PT RTRN 9FT ADLT (ELECTROSURGICAL) ×1 IMPLANT
GAUZE SPONGE 4X4 12PLY STRL LF (GAUZE/BANDAGES/DRESSINGS) IMPLANT
GLOVE SURG SIGNA 7.5 PF LTX (GLOVE) ×1 IMPLANT
GOWN STRL REUS W/ TWL LRG LVL3 (GOWN DISPOSABLE) ×1 IMPLANT
GOWN STRL REUS W/ TWL XL LVL3 (GOWN DISPOSABLE) ×1 IMPLANT
KIT MARKER MARGIN INK (KITS) ×1 IMPLANT
NDL HYPO 25X1 1.5 SAFETY (NEEDLE) ×1 IMPLANT
NEEDLE HYPO 25X1 1.5 SAFETY (NEEDLE) ×1 IMPLANT
PACK BASIN DAY SURGERY FS (CUSTOM PROCEDURE TRAY) ×1 IMPLANT
PENCIL SMOKE EVACUATOR (MISCELLANEOUS) ×1 IMPLANT
SLEEVE SCD COMPRESS KNEE MED (STOCKING) ×1 IMPLANT
SOLN 0.9% NACL POUR BTL 1000ML (IV SOLUTION) IMPLANT
SPIKE FLUID TRANSFER (MISCELLANEOUS) IMPLANT
SPONGE T-LAP 4X18 ~~LOC~~+RFID (SPONGE) ×1 IMPLANT
SUT MNCRL AB 4-0 PS2 18 (SUTURE) ×1 IMPLANT
SUT SILK 2 0 SH (SUTURE) IMPLANT
SUT VIC AB 3-0 SH 27X BRD (SUTURE) ×1 IMPLANT
SYR CONTROL 10ML LL (SYRINGE) ×1 IMPLANT
TOWEL GREEN STERILE FF (TOWEL DISPOSABLE) ×1 IMPLANT
TRAY FAXITRON CT DISP (TRAY / TRAY PROCEDURE) ×1 IMPLANT
TUBE CONNECTING 20X1/4 (TUBING) IMPLANT
YANKAUER SUCT BULB TIP NO VENT (SUCTIONS) IMPLANT

## 2024-02-13 NOTE — Transfer of Care (Signed)
 Immediate Anesthesia Transfer of Care Note  Patient: Sandra Juarez  Procedure(s) Performed: BREAST LUMPECTOMY WITH RADIOACTIVE SEED LOCALIZATION (Right: Breast)  Patient Location: PACU  Anesthesia Type:General  Level of Consciousness: drowsy, patient cooperative, and responds to stimulation  Airway & Oxygen Therapy: Patient Spontanous Breathing  Post-op Assessment: Report given to RN and Post -op Vital signs reviewed and stable  Post vital signs: Reviewed and stable  Last Vitals:  Vitals Value Taken Time  BP    Temp    Pulse 72 02/13/24 10:35  Resp 15 02/13/24 10:35  SpO2 98 % 02/13/24 10:35  Vitals shown include unfiled device data.  Last Pain:  Vitals:   02/13/24 0837  TempSrc: Temporal  PainSc: 0-No pain         Complications: No notable events documented.

## 2024-02-13 NOTE — Anesthesia Preprocedure Evaluation (Signed)
 Anesthesia Evaluation  Patient identified by MRN, date of birth, ID band Patient awake    Reviewed: Allergy & Precautions, NPO status , Patient's Chart, lab work & pertinent test results  History of Anesthesia Complications Negative for: history of anesthetic complications  Airway Mallampati: II  TM Distance: >3 FB Neck ROM: Full    Dental no notable dental hx. (+) Teeth Intact   Pulmonary neg pulmonary ROS, neg sleep apnea, neg COPD, Patient abstained from smoking.Not current smoker   Pulmonary exam normal breath sounds clear to auscultation       Cardiovascular Exercise Tolerance: Good METS(-) hypertension(-) CAD and (-) Past MI negative cardio ROS (-) dysrhythmias  Rhythm:Regular Rate:Normal - Systolic murmurs    Neuro/Psych  Headaches PSYCHIATRIC DISORDERS Anxiety Depression     Neuromuscular disease    GI/Hepatic ,neg GERD  ,,(+)     (-) substance abuse    Endo/Other  neg diabetes    Renal/GU negative Renal ROS     Musculoskeletal   Abdominal   Peds  Hematology   Anesthesia Other Findings Past Medical History: 2010: Abnormal Pap smear of cervix No date: Anaphylactic reaction     Comment:  x 2   (hospitalized) No date: Anxiety No date: Cervical disc disorder No date: Depression 03/2011, 03/2016: Encounter for insertion of Mirena  IUD 10/2016: Laceration of right little finger No date: Migraines No date: STD (sexually transmitted disease)     Comment:  HPV  Reproductive/Obstetrics                              Anesthesia Physical Anesthesia Plan  ASA: 2  Anesthesia Plan: General   Post-op Pain Management: Tylenol  PO (pre-op)* and Toradol IV (intra-op)*   Induction: Intravenous  PONV Risk Score and Plan: 3 and Ondansetron, Dexamethasone and Midazolam  Airway Management Planned: LMA  Additional Equipment: None  Intra-op Plan:   Post-operative Plan: Extubation in  OR  Informed Consent: I have reviewed the patients History and Physical, chart, labs and discussed the procedure including the risks, benefits and alternatives for the proposed anesthesia with the patient or authorized representative who has indicated his/her understanding and acceptance.     Dental advisory given  Plan Discussed with: CRNA and Surgeon  Anesthesia Plan Comments: (Discussed risks of anesthesia with patient, including PONV, sore throat, lip/dental/eye damage. Rare risks discussed as well, such as cardiorespiratory and neurological sequelae, and allergic reactions. Discussed the role of CRNA in patient's perioperative care. Patient understands.)        Anesthesia Quick Evaluation

## 2024-02-13 NOTE — Op Note (Signed)
 Sandra Juarez 02/13/2024   Pre-op Diagnosis: DUCTAL CARCINOMA IN SITU RIGHT BREAST     Post-op Diagnosis: SAME  Procedure(s): RADIOACTIVE SEED GUIDED RIGHT BREAST LUMPECTOMY  Surgeon(s): Vernetta Berg, MD  Anesthesia: General  Staff:  Circulator: Eliberto Geroge HERO, RN; Mannie Ellouise LABOR, RN Scrub Person: Milford Asberry BURNETT Lelon Daphne JAYSON, RN  Estimated Blood Loss: Minimal               Specimens: SENT TO PATH  Indications: This is a 53 year old female who was found on recent follow-up mammography to have calcifications in the upper quadrant of the right breast.  The area measured 7 mm.  A biopsy showed ductal carcinoma in situ.  The decision was made to proceed with a radioactive seed guided right breast lumpectomy.  During the placement by radiology it was felt that the original biopsy clip had migrated and the seed was placed there.  A second seed was placed at the area with respect to be the original biopsy site. She has had a previous benign biopsy as well marked with a coil clip in the past  Findings: The lumpectomy specimen contained the radioactive seed at the X clip as well as the previous benign biopsy with a coil clip.  The other radioactive seed marking was thought to be the original biopsy site came out of the specimen and was x-rayed separately. A second lateral margin was sent as well.  Anterior margin is scanned and posterior margin is chest wall   Procedure: The patient was brought to the operating and identified as correct patient.  She was placed upon the operating table and general anesthesia was induced.  Her right breast was prepped and draped in usual sterile fashion.  I located the radioactive seeds in the upper inner quadrant of the right breast with the neoprobe.  I anesthetized the medial edge of the areola with Marcaine and then made a circumareolar incision with a scalpel.  I then dissected down to the breast tissue with the electrocautery.   I then attempted to stay widely around both radioactive seeds dissecting lateral to the signals as well as superiorly and inferiorly in the back toward the nipple areolar complex.  As I was completing the lumpectomy the more lateral see close the chest wall popped out the specimen.  This was placed in a cup and x-rayed separately.  I then completed the lumpectomy.  I marked all margins with paint.  I then took further lateral margin at the area of the seed and sent this separately to pathology.  An x-ray on specimen confirmed that the original biopsy clip as well as benign biopsy clip and one of the seeds in the specimen and again the second seed was in a cup.  All tissue was sent to pathology for evaluation.  I then achieved hemostasis with the cautery and the lumpectomy cavity.  I anesthetized it further with Marcaine.  I placed surgical clips around the periphery of the lumpectomy cavity.  I then closed the subcutaneous tissue with interrupted 3-0 Vicryl sutures and closed the skin with a running 4-0 Monocryl.  Dermabond was then applied.  The patient tolerated the procedure well.  All the counts were correct at the end of the procedure.  She was placed in a breast binder.  The patient was then extubated in the operating room and taken in stable condition to the recovery room.          Berg Vernetta   Date:  02/13/2024  Time: 10:23 AM

## 2024-02-13 NOTE — Discharge Instructions (Addendum)
 [facility] Office Phone Number [phone]  BREAST BIOPSY/ PARTIAL MASTECTOMY: POST OP INSTRUCTIONS  Always review your discharge instruction sheet given to you by the facility where your surgery was performed.  IF YOU HAVE DISABILITY OR FAMILY LEAVE FORMS, YOU MUST BRING THEM TO THE OFFICE FOR PROCESSING.  DO NOT GIVE THEM TO YOUR DOCTOR.  A prescription for pain medication may be given to you upon discharge.  Take your pain medication as prescribed, if needed.  If narcotic pain medicine is not needed, then you may take acetaminophen (Tylenol) or ibuprofen (Advil) as needed. Take your usually prescribed medications unless otherwise directed If you need a refill on your pain medication, please contact your pharmacy.  They will contact our office to request authorization.  Prescriptions will not be filled after 5pm or on week-ends. You should eat very light the first 24 hours after surgery, such as soup, crackers, pudding, etc.  Resume your normal diet the day after surgery. Most patients will experience some swelling and bruising in the breast.  Ice packs and a good support bra will help.  Swelling and bruising can take several days to resolve.  It is common to experience some constipation if taking pain medication after surgery.  Increasing fluid intake and taking a stool softener will usually help or prevent this problem from occurring.  A mild laxative (Milk of Magnesia or Miralax) should be taken according to package directions if there are no bowel movements after 48 hours. Unless discharge instructions indicate otherwise, you may remove your bandages 24-48 hours after surgery, and you may shower at that time.  You may have steri-strips (small skin tapes) in place directly over the incision.  These strips should be left on the skin for 7-10 days.  If your surgeon used skin glue on the incision, you may shower in 24 hours.  The glue will flake off over the next 2-3 weeks.  Any  sutures or staples will be removed at the office during your follow-up visit. ACTIVITIES:  You may resume regular daily activities (gradually increasing) beginning the next day.  Wearing a good support bra or sports bra minimizes pain and swelling.  You may have sexual intercourse when it is comfortable. You may drive when you no longer are taking prescription pain medication, you can comfortably wear a seatbelt, and you can safely maneuver your car and apply brakes. RETURN TO WORK:  ______________________________________________________________________________________ Rosine should see your doctor in the office for a follow-up appointment approximately two weeks after your surgery.  Your doctor's nurse will typically make your follow-up appointment when she calls you with your pathology report.  Expect your pathology report 2-3 business days after your surgery.  You may call to check if you do not hear from us  after three days. OTHER INSTRUCTIONS: YOU MAY REMOVE THE BINDER AND SHOWER STARTING TOMORROW AND THEN PUT THE BINDER BACK ON ICE PACK, TYLENOL, AND IBUPROFEN ALSO FOR PAIN NO VIGOROUS ACTIVITY FOR ONE WEEK _______________________________________________________________________________________________ _____________________________________________________________________________________________________________________________________ _____________________________________________________________________________________________________________________________________ _____________________________________________________________________________________________________________________________________  WHEN TO CALL YOUR DOCTOR: Fever over 101.0 Nausea and/or vomiting. Extreme swelling or bruising. Continued bleeding from incision. Increased pain, redness, or drainage from the incision.  The clinic staff is available to answer your questions during regular business hours.  Please don't hesitate to  call and ask to speak to one of the nurses for clinical concerns.  If you have a medical emergency, go to the nearest emergency room or call 911.  A surgeon from [facility] is always  on call at the hospital.  For further questions, please visit [email or web address]    Post Anesthesia Home Care Instructions  Activity: Get plenty of rest for the remainder of the day. A responsible individual must stay with you for 24 hours following the procedure.  For the next 24 hours, DO NOT: -Drive a car -Advertising copywriter -Drink alcoholic beverages -Take any medication unless instructed by your physician -Make any legal decisions or sign important papers.  Meals: Start with liquid foods such as gelatin or soup. Progress to regular foods as tolerated. Avoid greasy, spicy, heavy foods. If nausea and/or vomiting occur, drink only clear liquids until the nausea and/or vomiting subsides. Call your physician if vomiting continues.  Special Instructions/Symptoms: Your throat may feel dry or sore from the anesthesia or the breathing tube placed in your throat during surgery. If this causes discomfort, gargle with warm salt water. The discomfort should disappear within 24 hours.  If you had a scopolamine patch placed behind your ear for the management of post- operative nausea and/or vomiting:  1. The medication in the patch is effective for 72 hours, after which it should be removed.  Wrap patch in a tissue and discard in the trash. Wash hands thoroughly with soap and water. 2. You may remove the patch earlier than 72 hours if you experience unpleasant side effects which may include dry mouth, dizziness or visual disturbances. 3. Avoid touching the patch. Wash your hands with soap and water after contact with the patch.     Next dose of tylenol may be taken at 2p

## 2024-02-13 NOTE — Anesthesia Procedure Notes (Signed)
 Procedure Name: LMA Insertion Date/Time: 02/13/2024 9:38 AM  Performed by: Julieanne Fairy BROCKS, CRNAPre-anesthesia Checklist: Patient identified, Emergency Drugs available, Suction available and Patient being monitored Patient Re-evaluated:Patient Re-evaluated prior to induction Oxygen Delivery Method: Circle system utilized Preoxygenation: Pre-oxygenation with 100% oxygen Induction Type: IV induction Ventilation: Mask ventilation without difficulty LMA: LMA inserted LMA Size: 4.0 and 3.0 Number of attempts: 1 Airway Equipment and Method: Bite block Placement Confirmation: positive ETCO2 Tube secured with: Tape Dental Injury: Teeth and Oropharynx as per pre-operative assessment

## 2024-02-13 NOTE — Anesthesia Postprocedure Evaluation (Signed)
 Anesthesia Post Note  Patient: Sandra Juarez  Procedure(s) Performed: BREAST LUMPECTOMY WITH RADIOACTIVE SEED LOCALIZATION (Right: Breast)     Patient location during evaluation: PACU Anesthesia Type: General Level of consciousness: awake and alert Pain management: pain level controlled Vital Signs Assessment: post-procedure vital signs reviewed and stable Respiratory status: spontaneous breathing, nonlabored ventilation, respiratory function stable and patient connected to nasal cannula oxygen Cardiovascular status: blood pressure returned to baseline and stable Postop Assessment: no apparent nausea or vomiting Anesthetic complications: no   No notable events documented.  Last Vitals:  Vitals:   02/13/24 1100 02/13/24 1110  BP: 116/64 129/81  Pulse: 67 74  Resp: 15 16  Temp:  (!) 36.3 C  SpO2: 98% 99%    Last Pain:  Vitals:   02/13/24 1126  TempSrc:   PainSc: 4                  Rome Ade

## 2024-02-13 NOTE — Interval H&P Note (Signed)
 History and Physical Interval Note:no change in H and P  02/13/2024 8:18 AM  Sandra Juarez  has presented today for surgery, with the diagnosis of DUCTAL CARCINOMA IN SITU RIGHT BREAST.  The various methods of treatment have been discussed with the patient and family. After consideration of risks, benefits and other options for treatment, the patient has consented to  Procedure(s): BREAST LUMPECTOMY WITH RADIOACTIVE SEED LOCALIZATION (Right) as a surgical intervention.  The patient's history has been reviewed, patient examined, no change in status, stable for surgery.  I have reviewed the patient's chart and labs.  Questions were answered to the patient's satisfaction.     Vicenta Poli

## 2024-02-14 ENCOUNTER — Ambulatory Visit: Admitting: Neurology

## 2024-02-14 ENCOUNTER — Encounter (HOSPITAL_BASED_OUTPATIENT_CLINIC_OR_DEPARTMENT_OTHER): Payer: Self-pay | Admitting: Surgery

## 2024-02-15 LAB — SURGICAL PATHOLOGY

## 2024-02-16 ENCOUNTER — Encounter: Payer: Self-pay | Admitting: General Practice

## 2024-02-16 NOTE — Progress Notes (Signed)
 [facility] Spiritual Care Note  Attempted [facility] follow-up call. Left voicemail with direct number, encouraging return call when helpful.  [address] [place], [place], [facility] Pager [phone] Voicemail [phone]

## 2024-02-17 ENCOUNTER — Ambulatory Visit: Payer: Self-pay | Admitting: Licensed Clinical Social Worker

## 2024-02-17 ENCOUNTER — Encounter: Payer: Self-pay | Admitting: Licensed Clinical Social Worker

## 2024-02-17 ENCOUNTER — Encounter: Payer: Self-pay | Admitting: *Deleted

## 2024-02-17 ENCOUNTER — Telehealth: Payer: Self-pay | Admitting: Licensed Clinical Social Worker

## 2024-02-17 DIAGNOSIS — Z1379 Encounter for other screening for genetic and chromosomal anomalies: Secondary | ICD-10-CM | POA: Insufficient documentation

## 2024-02-17 NOTE — Progress Notes (Signed)
 HPI:   Sandra Juarez was previously seen in the [facility] due to a personal and family history of cancer and concerns regarding a hereditary predisposition to cancer. Please refer to our prior cancer genetics clinic note for more information regarding our discussion, assessment and recommendations, at the time. Sandra Juarez's recent genetic test results were disclosed to Sandra Juarez, as were recommendations warranted by these results. These results and recommendations are discussed in more detail below.  CANCER HISTORY:  Oncology History  Ductal carcinoma in situ (DCIS) of right breast  01/23/2024 Initial Diagnosis   2-year follow-up of calcifications (previous benign right breast biopsy): New linear branching calcifications spanning 7 mm stereotactic biopsy: Intermediate grade DCIS with necrosis and calcifications ER 95%, PR 99%   01/23/2024 Cancer Staging   Staging form: Breast, AJCC 8th Edition - Clinical stage from 01/23/2024: Stage 0 (cTis (DCIS), cN0, cM0, ER+, PR+, HER2: Not Assessed) - Signed by Odean Potts, MD on 02/01/2024 Stage prefix: Initial diagnosis Nuclear grade: G2   02/11/2024 Genetic Testing   Negative genetic testing. No pathogenic variants identified on the [facility] CancerNext+RNA Panel. The report date is 02/11/2024.  The [facility] Cancer[facility]+RNAinsight Panel includes sequencing, rearrangement analysis, and RNA analysis for the following 40 genes: APC, ATM, BAP1, BARD1, BMPR1A, BRCA1, BRCA2, BRIP1, CDH1, CDKN2A, CHEK2, FH, FLCN, MET, MLH1, MSH2, MSH6, MUTYH, NF1, NTHL1, PALB2, PMS2, PTEN, RAD51C, RAD51D, RPS20, SMAD4, STK11, TP53, TSC1, TSC2, and VHL (sequencing and deletion/duplication); AXIN2, HOXB13, MBD4, MSH3, POLD1 and POLE (sequencing only); EPCAM and GREM1 (deletion/duplication only).     FAMILY HISTORY:  We obtained a detailed, 4-generation family history.  Significant diagnoses are listed below: Family History  Problem Relation Age of Onset    Multiple sclerosis Mother    Cancer Paternal Grandfather 55       dec   Hypertension Brother    Diabetes Brother        AODM   Breast cancer Neg Hx         GENETIC TEST RESULTS:  The [facility]+RNA Panel found no pathogenic mutations.   The [facility] CancerNext+RNAinsight Panel includes sequencing, rearrangement analysis, and RNA analysis for the following 40 genes: APC, ATM, BAP1, BARD1, BMPR1A, BRCA1, BRCA2, BRIP1, CDH1, CDKN2A, CHEK2, FH, FLCN, MET, MLH1, MSH2, MSH6, MUTYH, NF1, NTHL1, PALB2, PMS2, PTEN, RAD51C, RAD51D, RPS20, SMAD4, STK11, TP53, TSC1, TSC2, and VHL (sequencing and deletion/duplication); AXIN2, HOXB13, MBD4, MSH3, POLD1 and POLE (sequencing only); EPCAM and GREM1 (deletion/duplication only).   The test report has been scanned into EPIC and is located under the Molecular Pathology section of the Results Review tab.  A portion of the result report is included below for reference. Genetic testing reported out on 02/11/2024.      Even though a pathogenic variant was not identified, possible explanations for the cancer in the family may include: There may be no hereditary risk for cancer in the family. The cancers in Sandra Juarez and/or Sandra Juarez family may be sporadic/familial or due to other genetic and environmental factors. There may be a gene mutation in one of these genes that current testing methods cannot detect but that chance is small. There could be another gene that has not yet been discovered, or that we have not yet tested, that is responsible for the cancer diagnoses in the family.  It is also possible there is a hereditary cause for the cancer in the family that Sandra Juarez did not inherit.  Therefore, it is important to remain in touch with  cancer genetics in the future so that we can continue to offer Sandra Juarez the most up to date genetic testing.   ADDITIONAL GENETIC TESTING:  We discussed with Sandra Juarez that Sandra Juarez genetic testing was fairly extensive.   If there are additional relevant genes identified to increase cancer risk that can be analyzed in the future, we would be happy to discuss and coordinate this testing at that time.    CANCER SCREENING RECOMMENDATIONS:  Sandra Juarez test result is considered negative (normal).  This means that we have not identified a hereditary cause for Sandra Juarez personal and family history of cancer at this time.   An individual's cancer risk and medical management are not determined by genetic test results alone. Overall cancer risk assessment incorporates additional factors, including personal medical history, family history, and any available genetic information that may result in a personalized plan for cancer prevention and surveillance. Therefore, it is recommended Sandra Juarez continue to follow the cancer management and screening guidelines provided by Sandra Juarez oncology and primary healthcare provider.  RECOMMENDATIONS FOR FAMILY MEMBERS:   Since Sandra Juarez did not inherit a identifiable mutation in a cancer predisposition gene included on this panel, Sandra Juarez children could not have inherited a known mutation from Sandra Juarez in one of these genes. Individuals in this family might be at some increased risk of developing cancer, over the general population risk, due to the family history of cancer.  Individuals in the family should notify their providers of the family history of cancer. We recommend women in this family have a yearly mammogram beginning at age 78, or 25 years younger than the earliest onset of cancer, an annual clinical breast exam, and perform monthly breast self-exams.  Family members should have colonoscopies by at age 53, or earlier, as recommended by their providers.  FOLLOW-UP:  Lastly, we discussed with Sandra Juarez that cancer genetics is a rapidly advancing field and it is possible that new genetic tests will be appropriate for Sandra Juarez and/or Sandra Juarez family members in the future. We encouraged Sandra Juarez to remain in contact with cancer  genetics on an annual basis so we can update Sandra Juarez personal and family histories and let Sandra Juarez know of advances in cancer genetics that may benefit this family.   Our contact number was provided. Sandra Juarez's questions were answered to Sandra Juarez satisfaction, and Sandra Juarez knows Sandra Juarez is welcome to call us  at anytime with additional questions or concerns.    Dena Cary, MS, [facility] Genetic Counselor [email or web address] Phone: [phone]

## 2024-02-17 NOTE — Telephone Encounter (Signed)
 I contacted Ms. Kearl to discuss her genetic testing results. No pathogenic variants were identified in the 40 genes analyzed. Detailed clinic note to follow.   The test report has been scanned into EPIC and is located under the Molecular Pathology section of the Results Review tab.  A portion of the result report is included below for reference.      Dena Cary, MS, [facility] Genetic Counselor [email or web address] Phone: [phone]

## 2024-02-20 ENCOUNTER — Ambulatory Visit: Admitting: Neurology

## 2024-02-28 ENCOUNTER — Encounter: Payer: Self-pay | Admitting: Neurology

## 2024-02-28 ENCOUNTER — Ambulatory Visit (INDEPENDENT_AMBULATORY_CARE_PROVIDER_SITE_OTHER): Admitting: Neurology

## 2024-02-28 VITALS — BP 114/70 | HR 58 | Ht 67.0 in | Wt 128.2 lb

## 2024-02-28 DIAGNOSIS — R41840 Attention and concentration deficit: Secondary | ICD-10-CM | POA: Diagnosis not present

## 2024-02-28 DIAGNOSIS — R4189 Other symptoms and signs involving cognitive functions and awareness: Secondary | ICD-10-CM | POA: Insufficient documentation

## 2024-02-28 DIAGNOSIS — F4329 Adjustment disorder with other symptoms: Secondary | ICD-10-CM | POA: Insufficient documentation

## 2024-02-28 DIAGNOSIS — Z818 Family history of other mental and behavioral disorders: Secondary | ICD-10-CM | POA: Insufficient documentation

## 2024-02-28 DIAGNOSIS — F0781 Postconcussional syndrome: Secondary | ICD-10-CM | POA: Insufficient documentation

## 2024-02-28 MED ORDER — TRAZODONE HCL 50 MG PO TABS: 50.0000 mg | ORAL_TABLET | Freq: Every evening | ORAL | 5 refills | Status: DC | PRN

## 2024-02-28 NOTE — Progress Notes (Signed)
 [facility]  Provider:  Dr Tamalyn Wadsworth Referring Provider: Cleotilde Ronal RAMAN, MD  Primary Care Physician:  Stephane [name], MD  Chief Complaint  Patient presents with   RM 1     Patient is here alone for memory - noticed she was having issue with her memory a year ago     HPI:  Sandra Juarez is a 53 y.o. female and seen here on 02/28/2024 upon referral from Dr. Cleotilde for a Consultation/ Evaluation of memory changes. He is mainly worried since both her parents struggle with dementia, father at 5 and mother at 17, paternal aunt started showing signs in her years 34-76 , same for maternal aunt.  Father  no longer can read, mom can't pay bills any longer.   She struggles with the stress, and she is feeling overwhelmed.  Mrs Sandra Juarez has a 76 and a 72 year old  child, has just completed intraductal breast cancer surgery, and awaits to start with  radiation later this months.   This patient reports onset of subjective memory  over a period of the last 18 months, feeling more often overwhelmed, needing memory crutches of  lists, memos, and calendar entry. Routines and order is very important now.  She has recently sent an invite with the wrong RSVP number.    Fam Hx: see above'  Social history,  she is married  with children,  masters degree .  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 Home schooled her child, and has been involved .  Non smoker, 2 glasses a night -  ( social drinker- now since dx of breast cancer -none).  Walking for exercise.    Routines : 7 AM wake up, rises at 7. 30,  lets the dog out, and plans for the day one day ahead. Naps - 30 minutes and bed time 9.30- bedroom is cool, dark, and quiet, wakes 2.30 and 3.30 AM- every night.  One time nocturia each night .  Takes Ambien as a sleep aid, 19 years.      Sandra Juarez is a 53 y.o. White or Caucasian female patient and is seen here on 09-03-2020 upon referral by Ronal Cleotilde, MD  Gynecology- on 09/03/2020.   Chief concern according to patient :  I take 5 mg generic Ambien each night to sleep, for about 15 years. And I have a mirena  for 15 years, my migraines have gotten better but cannot sleep unless she takes it. She sts she has tried several times to come off the medication but will be up for several days.  Pt is worried about the long term effects of being on this med. Wanted to discuss other options.     Mylah Heggie Presas  has a past medical history of Abnormal Pap smear of cervix (2010), Anaphylactic reaction, Anxiety, Depression, Encounter for insertion of mirena  IUD (03/2011, 03/2016), Laceration of right little finger (10/2016), Migraines, and vaginal yeast infection.   Sleep relevant medical history: used to have morning headache- and these evolved over the day to bad migraine-nausea, photophobia, phonophobia, hightened sense of smell.   cervical spine injury- radiculopathy, left arm tingling.     Family medical /sleep history:  other family member on CPAP with OSA, insomnia, sleep walkers. mother with MS and PD(!).    Social history:  Patient is a futures trader, has 2 boys, teenagers [name] and  [name] (  with learning disability- by degree AUDIOLOGIST, worked for [employer] cochlear implant and lives in a  household with 4 persons . Lot of volunteer activity. Was home schooling, now no longer.  somewhat isolating.  Pets are present. Dog named Barkely. Tobacco use; never.  ETOH use: 2-3 glasses of wine 3-4 days a week.    Caffeine intake in form of Coffee( 1-2 cups in AM )  Regular exercise in form of walking, pilates.      Sleep habits are as follows: The patient's dinner time is between 6 PM. The patient goes to bed at 9.30 PM and continues to sleep for 3-4 hours, wakes once for a bathroom break- can go back to sleep when on  AMBIEN.   The preferred sleep position is on the right side,  with the support of multiple pillows.  Dreams are reportedly newly frequent/vivid.   6.15 AM is the usual rise time.  The patient wakes up spontaneously.  She reports  feeling refreshed or restored in AM, with symptoms such as feeling more tired- Naps are taken frequently, lasting from 3-4 PM. 60 minutes and are refreshing..    Review of Systems: Out of a complete 14 system review, the patient complains of only the following symptoms, and all other reviewed systems are negative.    02/28/2024   10:17 AM  Montreal Cognitive Assessment   Visuospatial/ Executive (0/5) 5  Naming (0/3) 3  Attention: Read list of digits (0/2) 2  Attention: Read list of letters (0/1) 1  Attention: Serial 7 subtraction starting at 100 (0/3) 2  Language: Repeat phrase (0/2) 1  Language : Fluency (0/1) 1  Abstraction (0/2) 2  Delayed Recall (0/5) 5  Orientation (0/6) 6  Total 28  Adjusted Score (based on education) 28     Social History   Socioeconomic History   Marital status: Married    Spouse name: Arrayah Connors   Number of children: 2   Years of education: Masters   Highest education level: Not on file  Occupational History   Not on file  Tobacco Use   Smoking status: Never    Passive exposure: Never   Smokeless tobacco: Never  Vaping Use   Vaping status: Never Used  Substance and Sexual Activity   Alcohol use: Yes    Alcohol/week: 14.0 standard drinks of alcohol    Types: 14 Glasses of wine per week   Drug use: No   Sexual activity: Yes    Partners: Male  Other Topics Concern   Not on file  Social History Narrative   Lives at home with husband and 2 children   Caffeine use: occasionally, 1-2 cups of coffee daily    Social Drivers of Corporate Investment Banker Strain: Not on file  Food Insecurity: No Food Insecurity (02/01/2024)   Hunger Vital Sign    Worried About Running Out of Food in the Last Year: Never true    Ran Out of Food in the Last Year: Never true  Transportation Needs: No Transportation Needs (02/01/2024)   PRAPARE - Scientist, Research (physical Sciences) (Medical): No    Lack of Transportation (Non-Medical): No  Physical Activity: Not on file  Stress: Not on file  Social Connections: Not on file  Intimate Partner Violence: Not At Risk (02/01/2024)   Humiliation, Afraid, Rape, and Kick questionnaire    Fear of Current or Ex-Partner: No    Emotionally Abused: No    Physically Abused: No    Sexually Abused: No    Family History  Problem Relation Age of Onset  Migraines Mother    Memory loss Mother    Multiple sclerosis Mother    Memory loss Father    Hypertension Brother    Diabetes Brother        AODM   Cancer Paternal Grandfather 86       dec   Memory loss Maternal Aunt    Memory loss Paternal Aunt    Breast cancer Neg Hx    Stroke Neg Hx    Seizures Neg Hx    Sleep apnea Neg Hx     Past Medical History:  Diagnosis Date   Abnormal Pap smear of cervix 2010   Anaphylactic reaction    x 2   (hospitalized)   Anxiety    Cervical disc disorder    Depression    Encounter for insertion of Mirena  IUD 03/2011, 03/2016   Laceration of right little finger 10/2016   Migraines    STD (sexually transmitted disease)    HPV    Past Surgical History:  Procedure Laterality Date   BREAST BIOPSY Right 01/23/2024   MM RT BREAST BX W LOC DEV 1ST LESION IMAGE BX SPEC STEREO GUIDE 01/23/2024 [facility]   BREAST BIOPSY  02/10/2024   MM RT RADIOACTIVE SEED LOC MAMMO GUIDE 02/10/2024 [facility]   BREAST BIOPSY  02/10/2024   MM RT RADIOACTIVE SEED EA ADD LESION LOC MAMMO GUIDE 02/10/2024 [facility]   BREAST LUMPECTOMY WITH RADIOACTIVE SEED LOCALIZATION Right 02/13/2024   Procedure: BREAST LUMPECTOMY WITH RADIOACTIVE SEED LOCALIZATION;  Surgeon: Vernetta Berg, MD;  Location: [facility];  Service: General;  Laterality: Right;   CESAREAN SECTION  2004 ,2006   x 2   COLPOSCOPY  02/2009   Low-grade squamous intraepithelial lesion (LGSIL) on Bx   FINGER SURGERY Right 10/2016   Little  finger    Current Outpatient Medications  Medication Sig Dispense Refill   citalopram (CELEXA) 20 MG tablet Take 20 mg by mouth daily.  3   EPINEPHrine  0.3 mg/0.3 mL IJ SOAJ injection Inject 0.3 mLs (0.3 mg total) into the muscle as needed for anaphylaxis. 1 each 1   rizatriptan (MAXALT-MLT) 10 MG disintegrating tablet Take 1 tablet by mouth daily as needed.     traMADol (ULTRAM) 50 MG tablet Take 1 tablet (50 mg total) by mouth every 6 (six) hours as needed for moderate pain (pain score 4-6) or severe pain (pain score 7-10). 20 tablet 0   zolpidem (AMBIEN) 10 MG tablet Take 5-10 mg by mouth at bedtime as needed.     No current facility-administered medications for this visit.    Allergies as of 02/28/2024 - Review Complete 02/28/2024  Allergen Reaction Noted   Lidocaine Other (See Comments) 07/30/2011    Vitals: BP 114/70 (Cuff Size: Normal)   Pulse (!) 58   Ht 5' 7 (1.702 m)   Wt 128 lb 3.2 oz (58.2 kg)   LMP 01/25/2024   SpO2 99%   BMI 20.08 kg/m   @No  data found.     Physical exam:  General: The patient is awake, alert and appears not in acute distress.  The patient is well groomed. Head: Normocephalic, atraumatic.  Neck is supple.  Mallampati 1,  neck circumference: 13 inches .  Nasal airflow patent.  Retrognathia is not seen.  Dental status: biological , wears a bite guard. Bruxism.  Cardiovascular:  Regular rate and cardiac rhythm by pulse,  without distended neck veins. Respiratory: Lungs are clear to auscultation.  Skin:  Without evidence  of ankle edema, or rash. Trunk: The patient's posture is erect.   Neurologic exam : The patient is awake and alert, oriented to place and time.   Memory subjective described as intact.  Attention span & concentration ability appears normal.  Speech is fluent,  without  dysarthria, dysphonia or aphasia.  Mood and affect are appropriate.   Cranial nerves: no loss of smell or taste reported  Pupils are equal and briskly  reactive to light. Funduscopic exam deferred. .  Extraocular movements in vertical and horizontal planes were intact and without nystagmus. No Diplopia. Visual fields by finger perimetry are intact. Hearing was intact to soft voice and finger rubbing.    Facial sensation intact to fine touch.  Facial motor strength is symmetric and tongue and uvula move midline.  Neck ROM : rotation, tilt and flexion extension were normal for age and shoulder shrug was symmetrical.    Motor exam:  Symmetric bulk, tone and ROM.   Normal tone without cog wheeling, symmetric grip strength .   Sensory:  tingling the left upper extremity, right handed. C5 - C 6 radiculopathy.  Proprioception tested in the upper extremities was normal.  Assessment: Total time for face to face interview and examination, for review of  images and laboratory testing, neurophysiology testing and pre-existing records, including out-of -network , was 45 minutes. Assessment is as follows here:  1)  concern about memory impairment , increasingly since being a caretaker of both parents.  Needs lists, makes mistakes that she didn't used to make.  2) Insomnia- unchanged to 2022.  Trazodone  may be an alternative to Ambien.   Plan:  Treatment plan and additional workup planned after today includes:   1) ATN testing discussed with the  implications this may have for employment, family dynamics, anxiety , planning the future.  MRI brain with and without , no EEG needed. Consider neuropsychology:  Dr Hayden for consult.  2) Trazodone  prescribed.  Hope to replace the Ambien.   3) Rv in 3 months for discussion and summary.     The patient's condition requires frequent monitoring and adjustments in the treatment plan, reflecting the ongoing complexity of care.  This provider is the continuing focal point for all needed services for this condition.   Dedra Gores, MD  [facility] Board certified by The  [employer] of Sleep Medicine and Diplomate of the [employer] of Sleep Medicine. Board certified In Neurology through the ABPN, Fellow of the [employer] of Neurology.  dem

## 2024-02-28 NOTE — Patient Instructions (Addendum)
 MIND DIET :  Memory Compensation Strategies  Use WARM strategy.  W= write it down  A= associate it  R= repeat it  M= make a mental note  2.   You can keep a Glass Blower/designer.  Use a 3-ring notebook with sections for the following: calendar, important names and phone numbers,  medications, doctors' names/phone numbers, lists/reminders, and a section to journal what you did  each day.   3.    Use a calendar to write appointments down.  4.    Write yourself a schedule for the day.  This can be placed on the calendar or in a separate section of the Memory Notebook.  Keeping a  regular schedule can help memory.  5.    Use medication organizer with sections for each day or morning/evening pills.  You may need help loading it  6.    Keep a basket, or pegboard by the door.  Place items that you need to take out with you in the basket or on the pegboard.  You may also want to  include a message board for reminders.  7.    Use sticky notes.  Place sticky notes with reminders in a place where the task is performed.  For example:  turn off the  stove placed by the stove, lock the door placed on the door at eye level,  take your medications on  the bathroom mirror or by the place where you normally take your medications.  8.    Use alarms/timers.  Use while cooking to remind yourself to check on food or as a reminder to take your medicine, or as a  reminder to make a call, or as a reminder to perform another task, etc. Management of Memory Problems  There are some general things you can do to help manage your memory problems.  Your memory may not in fact recover, but by using techniques and strategies you will be able to manage your memory difficulties better.  1)  Establish a routine. Try to establish and then stick to a regular routine.  By doing this, you will get used to what to expect and you will reduce the need to rely on your memory.  Also, try to do things at the same time of  day, such as taking your medication or checking your calendar first thing in the morning. Think about think that you can do as a part of a regular routine and make a list.  Then enter them into a daily planner to remind you.  This will help you establish a routine.  2)  Organize your environment. Organize your environment so that it is uncluttered.  Decrease visual stimulation.  Place everyday items such as keys or cell phone in the same place every day (ie.  Basket next to front door) Use post it notes with a brief message to yourself (ie. Turn off light, lock the door) Use labels to indicate where things go (ie. Which cupboards are for food, dishes, etc.) Keep a notepad and pen by the telephone to take messages  3)  Memory Aids A diary or journal/notebook/daily planner Making a list (shopping list, chore list, to do list that needs to be done) Using an alarm as a reminder (kitchen timer or cell phone alarm) Using cell phone to store information (Notes, Calendar, Reminders) Calendar/White board placed in a prominent position Post-it notes  In order for memory aids to be useful, you need to have good habits.  It's no good remembering to make a note in your journal if you don't remember to look in it.  Try setting aside a certain time of day to look in journal.  4)  Improving mood and managing fatigue. There may be other factors that contribute to memory difficulties.  Factors, such as anxiety, depression and tiredness can affect memory. Regular gentle exercise can help improve your mood and give you more energy. Simple relaxation techniques may help relieve symptoms of anxiety Try to get back to completing activities or hobbies you enjoyed doing in the past. Learn to pace yourself through activities to decrease fatigue. Find out about some local support groups where you can share experiences with others. Try and achieve 7-8 hours of sleep at night. 1)  concern about memory impairment ,  increasingly since being a caretaker of both parents.  Needs lists, makes mistakes that she didn't used to make.  2) Insomnia- unchanged to 2022.  Trazodone  may be an alternative to Ambien.    Plan:  Treatment plan and additional workup planned after today includes:    1) ATN testing discussed with the  implications this may have for employment, family dynamics, anxiety , planning the future.  MRI brain with and without , no EEG needed. Consider neuropsychology:  Dr Hayden for consult.  2) Trazodone  prescribed.  Hope to replace the Ambien.    3) Rv in 3 months for discussion and summary.

## 2024-03-01 ENCOUNTER — Ambulatory Visit: Payer: Self-pay | Admitting: Neurology

## 2024-03-01 ENCOUNTER — Telehealth: Payer: Self-pay | Admitting: Neurology

## 2024-03-01 NOTE — Telephone Encounter (Signed)
 sent to [facility] they obtain Sandra Juarez [phone]

## 2024-03-05 NOTE — Telephone Encounter (Signed)
-----   Message from Borden Dohmeier sent at 03/01/2024  4:59 PM EST ----- This is a normal metabolic lab for someone who is borderline dehydrated.  (BUN  elevated is usual a sign of insufficient dilution)  ----- Message ----- From: Interface, [facility] Lab Results In Sent: 02/29/2024   7:38 AM EST To: Dedra Gores, MD

## 2024-03-10 NOTE — Progress Notes (Signed)
 Radiation Oncology         [phone] ________________________________  Name: Sandra Juarez        MRN: [id number]  Date of Service: 03/13/2024 DOB: 1970/06/02  RR:Tpoz, [name], MD  Odean Potts, MD     REFERRING PHYSICIAN: Odean Potts, MD   DIAGNOSIS: The encounter diagnosis was Ductal carcinoma in situ (DCIS) of right breast. D05.11    HISTORY OF PRESENT ILLNESS: Sandra Juarez is a 53 y.o. female seen in consultation for radiation therapy.  Briefly, this is a 53 yo F w/ hormone positive DCIS found on diagnostic mammogram obtained for fu of previously identified, biopsied and benign R breast calcs. She is s/p lumpectomy with Dr. Vernetta on 02/13/2024. Pathology demonstrated cribiform type DCIS, intermediate grade, with single cell necrosis, 8 mm in greatest dimension. All margins were greater than 10 mm. Final path staging pTis. Radiograph of the R breast specimen demonstrated the radioactive seed and biopsy marker clip.   She is recovering well from surgery. ROM is good. No hematoma or issues with wound healing.   PAST MEDICAL HISTORY:  Past Medical History:  Diagnosis Date   Abnormal Pap smear of cervix 2010   Anaphylactic reaction    x 2   (hospitalized)   Anxiety    Cervical disc disorder    Depression    Encounter for insertion of Mirena  IUD 03/2011, 03/2016   Laceration of right little finger 10/2016   Migraines    STD (sexually transmitted disease)    HPV       PAST SURGICAL HISTORY: Past Surgical History:  Procedure Laterality Date   BREAST BIOPSY Right 01/23/2024   MM RT BREAST BX W LOC DEV 1ST LESION IMAGE BX SPEC STEREO GUIDE 01/23/2024 [facility]   BREAST BIOPSY  02/10/2024   MM RT RADIOACTIVE SEED LOC MAMMO GUIDE 02/10/2024 [facility]   BREAST BIOPSY  02/10/2024   MM RT RADIOACTIVE SEED EA ADD LESION LOC MAMMO GUIDE 02/10/2024 [facility]   BREAST LUMPECTOMY WITH RADIOACTIVE SEED LOCALIZATION Right 02/13/2024    Procedure: BREAST LUMPECTOMY WITH RADIOACTIVE SEED LOCALIZATION;  Surgeon: Vernetta Berg, MD;  Location: [facility];  Service: General;  Laterality: Right;   CESAREAN SECTION  2004 ,2006   x 2   COLPOSCOPY  02/2009   Low-grade squamous intraepithelial lesion (LGSIL) on Bx   FINGER SURGERY Right 10/2016   Little finger     FAMILY HISTORY:  Family History  Problem Relation Age of Onset   Migraines Mother    Memory loss Mother    Multiple sclerosis Mother    Memory loss Father    Hypertension Brother    Diabetes Brother        AODM   Cancer Paternal Grandfather 34       dec   Memory loss Maternal Aunt    Memory loss Paternal Aunt    Breast cancer Neg Hx    Stroke Neg Hx    Seizures Neg Hx    Sleep apnea Neg Hx      SOCIAL HISTORY:  reports that she has never smoked. She has never been exposed to tobacco smoke. She has never used smokeless tobacco. She reports current alcohol use of about 14.0 standard drinks of alcohol per week. She reports that she does not use drugs.   ALLERGIES: Lidocaine   MEDICATIONS:  Current Outpatient Medications  Medication Sig Dispense Refill   citalopram (CELEXA) 20 MG tablet Take 20 mg by mouth  daily.  3   EPINEPHrine  0.3 mg/0.3 mL IJ SOAJ injection Inject 0.3 mLs (0.3 mg total) into the muscle as needed for anaphylaxis. 1 each 1   rizatriptan (MAXALT-MLT) 10 MG disintegrating tablet Take 1 tablet by mouth daily as needed.     traMADol  (ULTRAM ) 50 MG tablet Take 1 tablet (50 mg total) by mouth every 6 (six) hours as needed for moderate pain (pain score 4-6) or severe pain (pain score 7-10). 20 tablet 0   traZODone  (DESYREL ) 50 MG tablet Take 1 tablet (50 mg total) by mouth at bedtime as needed for sleep. 30 tablet 5   zolpidem (AMBIEN) 10 MG tablet Take 5-10 mg by mouth at bedtime as needed.     No current facility-administered medications for this encounter.     REVIEW OF SYSTEMS: The patient reports that she is doing  well overall and a review of symptoms is otherwise negative.      PHYSICAL EXAM:  Wt Readings from Last 3 Encounters:  03/13/24 126 lb 6 oz (57.3 kg)  02/28/24 128 lb 3.2 oz (58.2 kg)  02/13/24 125 lb 10.6 oz (57 kg)   Temp Readings from Last 3 Encounters:  03/13/24 (!) 97.3 F (36.3 C) (Temporal)  02/13/24 (!) 97.3 F (36.3 C)  02/01/24 98.1 F (36.7 C) (Temporal)   BP Readings from Last 3 Encounters:  03/13/24 123/77  02/28/24 114/70  02/13/24 129/81   Pulse Readings from Last 3 Encounters:  03/13/24 72  02/28/24 (!) 58  02/13/24 74   Pain Assessment Pain Score: 0-No pain/10   Physical Exam Vitals and nursing note reviewed.  Constitutional:      General: She is not in acute distress. HENT:     Head: Normocephalic.  Eyes:     Extraocular Movements: Extraocular movements intact.  Cardiovascular:     Rate and Rhythm: Normal rate.  Pulmonary:     Effort: Pulmonary effort is normal. No respiratory distress.  Abdominal:     General: There is no distension.  Musculoskeletal:        General: Normal range of motion.  Skin:    General: Skin is warm and dry.     Findings: No erythema.  Neurological:     General: No focal deficit present.     Mental Status: She is alert.  Psychiatric:        Mood and Affect: Mood normal.      Breast Exam:  Well-healing circumareolar R breast incision. No erythema, induration, or drainage. No palpable masses or nodularity at the surgical site. Mildly tender to palpation.  ECOG = 0   LABORATORY DATA:  Lab Results  Component Value Date   WBC 9.3 02/28/2024   HGB 13.4 02/28/2024   HCT 40.2 02/28/2024   MCV 95 02/28/2024   PLT 190 02/28/2024   Lab Results  Component Value Date   NA 140 02/28/2024   K 4.4 02/28/2024   CL 100 02/28/2024   CO2 27 02/28/2024   Lab Results  Component Value Date   ALT 12 02/28/2024   AST 12 02/28/2024   ALKPHOS 37 (L) 02/28/2024   BILITOT 0.3 02/28/2024      RADIOGRAPHY: MM Breast  Surgical Specimen Result Date: 02/13/2024 CLINICAL DATA:  Specimen radiograph. EXAM: SPECIMEN RADIOGRAPH OF THE RIGHT BREAST COMPARISON:  Previous exam(s). FINDINGS: Status post excision of the right breast. The radioactive seed and X shaped biopsy marker clip are present, completely intact, and were marked for pathology. Second radioactive seed is  in a surgical specimen container. Coil shaped biopsy marker clip is also in the specimen radiograph. IMPRESSION: Specimen radiograph of the right breast. Electronically Signed   By: Harrie Deis M.D.   On: 02/13/2024 11:13   MM Breast Surgical Specimen Result Date: 02/13/2024 CLINICAL DATA:  Specimen radiograph. EXAM: SPECIMEN RADIOGRAPH OF THE RIGHT BREAST COMPARISON:  Previous exam(s). FINDINGS: Status post excision of the right breast. The radioactive seed and X shaped biopsy marker clip are present, completely intact, and were marked for pathology. Second radioactive seed is in a surgical specimen container. Coil shaped biopsy marker clip is also in the specimen radiograph. IMPRESSION: Specimen radiograph of the right breast. Electronically Signed   By: Harrie Deis M.D.   On: 02/13/2024 11:13     PATHOLOGY:    R Breast Lumpectomy 02/13/24:  A. BREAST, RIGHT, LUMPECTOMY: Ductal carcinoma in situ, type cribriform, nuclear grade 2 of 3, with necrosis DCIS greatest dimension: 8 mm Margins: Negative     Closest margin: All margins >10 mm Prognostic markers: ER positive, PR positive Biopsy site and X clip Other findings: Fibrocystic change, usual ductal hyperplasia and papillary apocrine metaplasia See oncology table  B. BREAST, RIGHT ADDITIONAL LATERAL MARGIN, EXCISION: -  Benign breast tissue  ONCOLOGY TABLE:  DCIS OF THE BREAST:  Resection Procedure: Lumpectomy Specimen Laterality: Right Histologic Type: Ductal carcinoma in situ cribriform type with single cell and focal comedo-type necrosis. Size of DCIS: 8 mm grossly (majority biopsy  cavity with adjacent surrounding scattered DCIS) Nuclear Grade: 2 of 3 Necrosis: Focal comedo and single cell necrosis present Margins: All margins negative for DCIS; biopsy cavity is 1 mm from the anterior, superior and lateral margins but identifiable microscopic DCIS is greater than 10 mm from all margins including lateral given the new margin.      Specify Closest Margin (required only if <75mm): N/A; see above Regional Lymph Nodes: N/A; no lymph nodes submitted or found      Number of Lymph Nodes Examined: 0      Number of Sentinel Nodes Examined (if applicable): N/A      Number of Lymph Nodes with Macrometastases: N/A      Number of Lymph Nodes with Micrometastases): N/A      Number of Lymph Nodes with Isolated Tumor Cells (=0.2 mm or =200 cells): N/A      Extranodal Extension: N/A Breast Biomarker Testing Performed on Previous Biopsy:      Testing performed on Case Number: [id number].      Estrogen Receptor: Positive, 95% moderate to strong      Progesterone Receptor: Positive, 99% strong Pathologic Stage Classification (pTNM, AJCC 8th Edition): pTis, pN X Representative Tumor Block: A2 Comment(s): None (v4.4.0.0)    IMPRESSION/PLAN:   Patient with 8 mm ER/PR+ DCIS, intermediate grade, s/p lumpectomy w/ widely negative margins. We discussed the role of breast irradiation in reducing the risk of local recurrence and improving long-term disease control.  We reviewed the logistics of treatment in detail, including the need for a CT simulation for treatment planning, followed by several days for contouring, dosimetry, and quality assurance prior to starting therapy.   The planned course of radiation therapy will consist of daily treatments, Monday through Friday, over approximately 3 weeks.   We reviewed that each treatment session typically lasts only a few minutes, though positioning and setup may take additional time. The patient should plan to be in the department for  approximately 45 minutes per visit. She will be evaluated  by me at least once weekly during treatment to monitor for side effects, assess tolerance, and ensure it is safe to continue therapy.  We discussed acute and subacute side effects which are generally gradual in onset and typically include fatigue, skin erythema, tanning or hyperpigmentation, breast, edema or firmness and mild tenderness.  Less common but possible side effects include desquamation of the skin, decreased range of motion of the shoulder, and transient changes in breast size and texture. Long-term risk such as cosmetic changes, rare risk of rib fracture and cardiopulmonary effects were also reviewed.  Reassured patient that most acute side effects improve over weeks to months after completing therapy.   Patient verbalized understanding of these risks and benefits and she is in agreement with the plan.   Simulation scheduled for tomorrow.  --  Total time spent today in preparation for this visit was 60 minutes. This included patient care, imaging and path review, documentation, multidisciplinary discussion and coordination of care and follow up.    Estefana HERO. Maritza, M.D.

## 2024-03-12 ENCOUNTER — Ambulatory Visit: Admitting: Radiation Oncology

## 2024-03-12 ENCOUNTER — Ambulatory Visit

## 2024-03-12 NOTE — Progress Notes (Signed)
 Location of Breast Cancer:Ductal carcinoma in-situ of the R breast, D05.11   Histology per Pathology Report:  A. BREAST, RIGHT, LUMPECTOMY:  Ductal carcinoma in situ, type cribriform, nuclear grade 2 of 3, with  necrosis  DCIS greatest dimension: 8 mm  Margins: Negative      Closest margin: All margins >10 mm  Prognostic markers: ER positive, PR positive  Biopsy site and X clip  Other findings: Fibrocystic change, usual ductal hyperplasia and  papillary apocrine metaplasia  See oncology table   B. BREAST, RIGHT ADDITIONAL LATERAL MARGIN, EXCISION:  -  Benign breast tissue  Receptor Status: ER(Positive 95% moderate to strong), PR (Positive 99% strong), Her2-neu (), Ki-67()  Did patient present with symptoms (if so, please note symptoms) or was this found on screening mammography?: Mammogram  Past/Anticipated interventions by surgeon, if any: Right BREAST LUMPECTOMY WITH RADIOACTIVE SEED LOCALIZATION with Vernetta Berg, MD   Past/Anticipated interventions by medical oncology, if any:  Lymphedema issues, if any:  {:18581} {t:21944}   Pain issues, if any:  {:18581} {PAIN DESCRIPTION:21022940}  SAFETY ISSUES: Prior radiation? {:18581} Pacemaker/ICD? {:18581} Possible current pregnancy?{:18581} Is the patient on methotrexate? {:18581}  Current Complaints / other details:  ***

## 2024-03-13 ENCOUNTER — Ambulatory Visit
Admission: RE | Admit: 2024-03-13 | Discharge: 2024-03-13 | Disposition: A | Discharge status: Home / Self Care | Source: Ambulatory Visit | Attending: Radiation Oncology | Admitting: Radiation Oncology

## 2024-03-13 ENCOUNTER — Ambulatory Visit
Admission: RE | Admit: 2024-03-13 | Discharge: 2024-03-13 | Disposition: A | Source: Ambulatory Visit | Attending: Radiation Oncology | Admitting: Radiation Oncology

## 2024-03-13 ENCOUNTER — Encounter: Payer: Self-pay | Admitting: Radiation Oncology

## 2024-03-13 VITALS — BP 123/77 | HR 72 | Temp 97.3°F | Resp 18 | Ht 67.0 in | Wt 126.4 lb

## 2024-03-13 DIAGNOSIS — D0511 Intraductal carcinoma in situ of right breast: Secondary | ICD-10-CM

## 2024-03-14 ENCOUNTER — Ambulatory Visit
Admission: RE | Admit: 2024-03-14 | Discharge: 2024-03-14 | Disposition: A | Discharge status: Home / Self Care | Source: Ambulatory Visit | Attending: Radiation Oncology | Admitting: Radiation Oncology

## 2024-03-14 DIAGNOSIS — Z1721 Progesterone receptor positive status: Secondary | ICD-10-CM | POA: Diagnosis not present

## 2024-03-14 DIAGNOSIS — Z510 Encounter for antineoplastic radiation therapy: Secondary | ICD-10-CM | POA: Diagnosis present

## 2024-03-14 DIAGNOSIS — D0511 Intraductal carcinoma in situ of right breast: Secondary | ICD-10-CM | POA: Insufficient documentation

## 2024-03-14 DIAGNOSIS — Z170 Estrogen receptor positive status [ER+]: Secondary | ICD-10-CM | POA: Diagnosis not present

## 2024-03-15 DIAGNOSIS — Z510 Encounter for antineoplastic radiation therapy: Secondary | ICD-10-CM | POA: Diagnosis not present

## 2024-03-15 LAB — APOE ALZHEIMER'S DISEASE RISK

## 2024-03-15 LAB — PROTEIN ELECTROPHORESIS, SERUM
A/G Ratio: 1.2 (ref 0.7–1.7)
Albumin ELP: 3.8 g/dL (ref 2.9–4.4)
Alpha 1: 0.2 g/dL (ref 0.0–0.4)
Alpha 2: 0.8 g/dL (ref 0.4–1.0)
Beta: 1 g/dL (ref 0.7–1.3)
Gamma Globulin: 1.1 g/dL (ref 0.4–1.8)
Globulin, Total: 3.1 g/dL (ref 2.2–3.9)

## 2024-03-15 LAB — CBC WITH DIFFERENTIAL/PLATELET
Basophils Absolute: 0 x10E3/uL (ref 0.0–0.2)
Basos: 0 %
EOS (ABSOLUTE): 0.1 x10E3/uL (ref 0.0–0.4)
Eos: 1 %
Hematocrit: 40.2 % (ref 34.0–46.6)
Hemoglobin: 13.4 g/dL (ref 11.1–15.9)
Immature Grans (Abs): 0 x10E3/uL (ref 0.0–0.1)
Immature Granulocytes: 0 %
Lymphocytes Absolute: 1.8 x10E3/uL (ref 0.7–3.1)
Lymphs: 19 %
MCH: 31.5 pg (ref 26.6–33.0)
MCHC: 33.3 g/dL (ref 31.5–35.7)
MCV: 95 fL (ref 79–97)
Monocytes Absolute: 0.7 x10E3/uL (ref 0.1–0.9)
Monocytes: 7 %
Neutrophils Absolute: 6.6 x10E3/uL (ref 1.4–7.0)
Neutrophils: 73 %
Platelets: 190 x10E3/uL (ref 150–450)
RBC: 4.25 x10E6/uL (ref 3.77–5.28)
RDW: 12 % (ref 11.7–15.4)
WBC: 9.3 x10E3/uL (ref 3.4–10.8)

## 2024-03-15 LAB — TSH+FREE T4
Free T4: 1.12 ng/dL (ref 0.82–1.77)
TSH: 1.53 u[IU]/mL (ref 0.450–4.500)

## 2024-03-15 LAB — COMPREHENSIVE METABOLIC PANEL WITH GFR
ALT: 12 IU/L (ref 0–32)
AST: 12 IU/L (ref 0–40)
Albumin: 4.5 g/dL (ref 3.8–4.9)
Alkaline Phosphatase: 37 IU/L — ABNORMAL LOW (ref 49–135)
BUN/Creatinine Ratio: 24 — ABNORMAL HIGH (ref 9–23)
BUN: 17 mg/dL (ref 6–24)
Bilirubin Total: 0.3 mg/dL (ref 0.0–1.2)
CO2: 27 mmol/L (ref 20–29)
Calcium: 9.9 mg/dL (ref 8.7–10.2)
Chloride: 100 mmol/L (ref 96–106)
Creatinine, Ser: 0.7 mg/dL (ref 0.57–1.00)
Globulin, Total: 2.4 g/dL (ref 1.5–4.5)
Glucose: 79 mg/dL (ref 70–99)
Potassium: 4.4 mmol/L (ref 3.5–5.2)
Sodium: 140 mmol/L (ref 134–144)
Total Protein: 6.9 g/dL (ref 6.0–8.5)
eGFR: 103 mL/min/1.73 (ref >59)

## 2024-03-15 LAB — SEDIMENTATION RATE: Sed Rate: 2 mm/h (ref 0–40)

## 2024-03-15 LAB — VITAMIN B12: Vitamin B-12: 561 pg/mL (ref 232–1245)

## 2024-03-15 LAB — ANA W/REFLEX: Anti Nuclear Antibody (ANA): NEGATIVE

## 2024-03-15 LAB — ATN PROFILE
A -- Beta-amyloid 42/40 Ratio: 0.127 (ref >0.102)
Beta-amyloid 40: 159.98 pg/mL
Beta-amyloid 42: 20.32 pg/mL
N -- NfL, Plasma: 1.88 pg/mL (ref 0.00–2.99)
T -- p-tau181: 0.39 pg/mL (ref 0.00–0.95)

## 2024-03-15 LAB — HOMOCYSTEINE: Homocysteine: 14.3 umol/L (ref 0.0–14.5)

## 2024-03-15 LAB — HEMOGLOBIN A1C
Est. average glucose Bld gHb Est-mCnc: 108 mg/dL
Hgb A1c MFr Bld: 5.4 % (ref 4.8–5.6)

## 2024-03-15 LAB — METHYLMALONIC ACID, SERUM: Methylmalonic Acid: 161 nmol/L (ref 0–378)

## 2024-03-16 ENCOUNTER — Ambulatory Visit
Admission: RE | Admit: 2024-03-16 | Discharge: 2024-03-16 | Attending: Radiation Oncology | Admitting: Radiation Oncology

## 2024-03-16 ENCOUNTER — Other Ambulatory Visit: Payer: Self-pay

## 2024-03-16 DIAGNOSIS — Z510 Encounter for antineoplastic radiation therapy: Secondary | ICD-10-CM | POA: Diagnosis not present

## 2024-03-16 LAB — RAD ONC ARIA SESSION SUMMARY
Course Elapsed Days: 0
Plan Fractions Treated to Date: 1
Plan Prescribed Dose Per Fraction: 2.67 Gy
Plan Total Fractions Prescribed: 15
Plan Total Prescribed Dose: 40.05 Gy
Reference Point Dosage Given to Date: 2.67 Gy
Reference Point Session Dosage Given: 2.67 Gy
Session Number: 1

## 2024-03-19 ENCOUNTER — Other Ambulatory Visit: Payer: Self-pay

## 2024-03-19 ENCOUNTER — Other Ambulatory Visit: Payer: Self-pay | Admitting: Radiation Oncology

## 2024-03-19 ENCOUNTER — Ambulatory Visit
Admission: RE | Admit: 2024-03-19 | Discharge: 2024-03-19 | Disposition: A | Source: Ambulatory Visit | Attending: Radiation Oncology

## 2024-03-19 ENCOUNTER — Ambulatory Visit: Admission: RE | Admit: 2024-03-19 | Discharge: 2024-03-19 | Attending: Radiation Oncology

## 2024-03-19 DIAGNOSIS — D0511 Intraductal carcinoma in situ of right breast: Secondary | ICD-10-CM

## 2024-03-19 DIAGNOSIS — Z510 Encounter for antineoplastic radiation therapy: Secondary | ICD-10-CM | POA: Diagnosis not present

## 2024-03-19 LAB — RAD ONC ARIA SESSION SUMMARY
Course Elapsed Days: 3
Plan Fractions Treated to Date: 2
Plan Prescribed Dose Per Fraction: 2.67 Gy
Plan Total Fractions Prescribed: 15
Plan Total Prescribed Dose: 40.05 Gy
Reference Point Dosage Given to Date: 5.34 Gy
Reference Point Session Dosage Given: 2.67 Gy
Session Number: 2

## 2024-03-19 MED ORDER — ALRA NON-METALLIC DEODORANT (RAD-ONC): 1.0000 | Freq: Once | TOPICAL | Status: DC

## 2024-03-19 MED ORDER — TRIAMCINOLONE ACETONIDE 0.1 % EX CREA: TOPICAL_CREAM | CUTANEOUS | 0 refills | Status: AC

## 2024-03-19 MED ORDER — RADIAPLEXRX EX GEL: Freq: Once | CUTANEOUS | Status: DC

## 2024-03-19 NOTE — Progress Notes (Signed)
 Pt here for patient teaching. Pt given Radiation and You booklet, skin care instructions, Alra deodorant, and Radiaplex gel. Reviewed areas of pertinence such as fatigue, hair loss, skin changes, breast tenderness, and breast swelling. Pt able to give teach back of to pat skin, use unscented/gentle soap, and drink plenty of water,apply Radiaplex bid, avoid applying anything to skin within 4 hours of treatment, avoid wearing an under wire bra, and to use an electric razor if they must shave. Pt verbalizes understanding of information given and will contact nursing with any questions or concerns.

## 2024-03-20 ENCOUNTER — Other Ambulatory Visit: Payer: Self-pay

## 2024-03-20 ENCOUNTER — Encounter: Payer: Self-pay | Admitting: *Deleted

## 2024-03-20 ENCOUNTER — Ambulatory Visit: Admission: RE | Admit: 2024-03-20 | Discharge: 2024-03-20 | Attending: Radiation Oncology

## 2024-03-20 DIAGNOSIS — Z510 Encounter for antineoplastic radiation therapy: Secondary | ICD-10-CM | POA: Diagnosis not present

## 2024-03-20 DIAGNOSIS — D0511 Intraductal carcinoma in situ of right breast: Secondary | ICD-10-CM

## 2024-03-20 LAB — RAD ONC ARIA SESSION SUMMARY
Course Elapsed Days: 4
Plan Fractions Treated to Date: 3
Plan Prescribed Dose Per Fraction: 2.67 Gy
Plan Total Fractions Prescribed: 15
Plan Total Prescribed Dose: 40.05 Gy
Reference Point Dosage Given to Date: 8.01 Gy
Reference Point Session Dosage Given: 2.67 Gy
Session Number: 3

## 2024-03-20 NOTE — Telephone Encounter (Signed)
 Spoke to patient gave labwork results gave Dr Chalice recommendation . Made patient aware to move forward with MRI scheduled 04/2024 . Pt states does drink lots of water daily . Pt expressed understanding and thanked me for calling

## 2024-03-20 NOTE — Telephone Encounter (Signed)
-----   Message from [place] Dohmeier sent at 03/14/2024  4:31 PM EST ----- Hydration, good old urology rule = BUN  changes day by day, based on water intake- its only significant if creatinine is elevated as well.  ----- Message ----- From: Shona Rojean LABOR, CMA Sent: 03/05/2024   4:00 PM EST To: Dedra Gores, MD  ----- Message from Rojean LABOR Shona, CMA sent at 03/05/2024  4:00 PM EST -----  Since the BUN is slightly elevated is there a plan of care for patient ?  ----- Message ----- From: Gores Dedra, MD Sent: 03/01/2024   4:59 PM EST To: Gna-Pod 3 Results  This is a normal metabolic lab for someone who is borderline dehydrated.  (BUN  elevated is usual a sign of insufficient dilution)  ----- Message ----- From: Interface, [facility] Lab Results In Sent: 02/29/2024   7:38 AM EST To: Dedra Gores, MD

## 2024-03-21 ENCOUNTER — Ambulatory Visit: Admission: RE | Admit: 2024-03-21 | Discharge: 2024-03-21 | Attending: Radiation Oncology

## 2024-03-21 ENCOUNTER — Ambulatory Visit
Admission: RE | Admit: 2024-03-21 | Discharge: 2024-03-21 | Disposition: A | Discharge status: Home / Self Care | Source: Ambulatory Visit | Attending: Radiation Oncology | Admitting: Radiation Oncology

## 2024-03-21 ENCOUNTER — Other Ambulatory Visit: Payer: Self-pay

## 2024-03-21 DIAGNOSIS — Z510 Encounter for antineoplastic radiation therapy: Secondary | ICD-10-CM | POA: Diagnosis not present

## 2024-03-21 LAB — RAD ONC ARIA SESSION SUMMARY
Course Elapsed Days: 5
Plan Fractions Treated to Date: 4
Plan Prescribed Dose Per Fraction: 2.67 Gy
Plan Total Fractions Prescribed: 15
Plan Total Prescribed Dose: 40.05 Gy
Reference Point Dosage Given to Date: 10.68 Gy
Reference Point Session Dosage Given: 2.67 Gy
Session Number: 4

## 2024-03-21 MED ORDER — TRIAMCINOLONE ACETONIDE 0.5 % EX OINT: TOPICAL_OINTMENT | CUTANEOUS | 0 refills | Status: AC

## 2024-03-22 ENCOUNTER — Other Ambulatory Visit: Payer: Self-pay | Admitting: Neurology

## 2024-03-22 ENCOUNTER — Encounter (HOSPITAL_COMMUNITY): Payer: Self-pay

## 2024-03-22 ENCOUNTER — Other Ambulatory Visit: Payer: Self-pay

## 2024-03-22 ENCOUNTER — Emergency Department (HOSPITAL_COMMUNITY)

## 2024-03-22 ENCOUNTER — Ambulatory Visit: Admission: RE | Admit: 2024-03-22 | Discharge: 2024-03-22 | Attending: Radiation Oncology

## 2024-03-22 ENCOUNTER — Emergency Department (HOSPITAL_COMMUNITY)
Admission: EM | Admit: 2024-03-22 | Discharge: 2024-03-22 | Disposition: A | Discharge status: Home / Self Care | Attending: Emergency Medicine | Admitting: Emergency Medicine

## 2024-03-22 DIAGNOSIS — S0083XA Contusion of other part of head, initial encounter: Secondary | ICD-10-CM

## 2024-03-22 DIAGNOSIS — R55 Syncope and collapse: Secondary | ICD-10-CM

## 2024-03-22 DIAGNOSIS — S01112A Laceration without foreign body of left eyelid and periocular area, initial encounter: Secondary | ICD-10-CM | POA: Diagnosis not present

## 2024-03-22 DIAGNOSIS — Z510 Encounter for antineoplastic radiation therapy: Secondary | ICD-10-CM | POA: Diagnosis not present

## 2024-03-22 DIAGNOSIS — R1084 Generalized abdominal pain: Secondary | ICD-10-CM | POA: Diagnosis not present

## 2024-03-22 DIAGNOSIS — Z853 Personal history of malignant neoplasm of breast: Secondary | ICD-10-CM | POA: Diagnosis not present

## 2024-03-22 DIAGNOSIS — R6884 Jaw pain: Secondary | ICD-10-CM | POA: Diagnosis not present

## 2024-03-22 DIAGNOSIS — W228XXA Striking against or struck by other objects, initial encounter: Secondary | ICD-10-CM | POA: Diagnosis not present

## 2024-03-22 DIAGNOSIS — S0181XA Laceration without foreign body of other part of head, initial encounter: Secondary | ICD-10-CM

## 2024-03-22 DIAGNOSIS — S0990XA Unspecified injury of head, initial encounter: Secondary | ICD-10-CM | POA: Diagnosis present

## 2024-03-22 DIAGNOSIS — Z23 Encounter for immunization: Secondary | ICD-10-CM | POA: Diagnosis not present

## 2024-03-22 DIAGNOSIS — S060X1A Concussion with loss of consciousness of 30 minutes or less, initial encounter: Secondary | ICD-10-CM | POA: Diagnosis not present

## 2024-03-22 LAB — RAD ONC ARIA SESSION SUMMARY
Course Elapsed Days: 6
Plan Fractions Treated to Date: 5
Plan Prescribed Dose Per Fraction: 2.67 Gy
Plan Total Fractions Prescribed: 15
Plan Total Prescribed Dose: 40.05 Gy
Reference Point Dosage Given to Date: 13.35 Gy
Reference Point Session Dosage Given: 2.67 Gy
Session Number: 5

## 2024-03-22 LAB — CBC WITH DIFFERENTIAL/PLATELET
Abs Immature Granulocytes: 0.03 K/uL (ref 0.00–0.07)
Basophils Absolute: 0.1 K/uL (ref 0.0–0.1)
Basophils Relative: 1 %
Eosinophils Absolute: 0.2 K/uL (ref 0.0–0.5)
Eosinophils Relative: 3 %
HCT: 36.2 % (ref 36.0–46.0)
Hemoglobin: 12.3 g/dL (ref 12.0–15.0)
Immature Granulocytes: 0 %
Lymphocytes Relative: 19 %
Lymphs Abs: 1.3 K/uL (ref 0.7–4.0)
MCH: 31.7 pg (ref 26.0–34.0)
MCHC: 34 g/dL (ref 30.0–36.0)
MCV: 93.3 fL (ref 80.0–100.0)
Monocytes Absolute: 0.5 K/uL (ref 0.1–1.0)
Monocytes Relative: 8 %
Neutro Abs: 4.9 K/uL (ref 1.7–7.7)
Neutrophils Relative %: 69 %
Platelets: 201 K/uL (ref 150–400)
RBC: 3.88 MIL/uL (ref 3.87–5.11)
RDW: 12.8 % (ref 11.5–15.5)
WBC: 7 K/uL (ref 4.0–10.5)
nRBC: 0 % (ref 0.0–0.2)

## 2024-03-22 LAB — MAGNESIUM: Magnesium: 2 mg/dL (ref 1.7–2.4)

## 2024-03-22 LAB — COMPREHENSIVE METABOLIC PANEL WITH GFR
ALT: 16 U/L (ref 0–44)
AST: 24 U/L (ref 15–41)
Albumin: 3.5 g/dL (ref 3.5–5.0)
Alkaline Phosphatase: 28 U/L — ABNORMAL LOW (ref 38–126)
Anion gap: 7 (ref 5–15)
BUN: 15 mg/dL (ref 6–20)
CO2: 26 mmol/L (ref 22–32)
Calcium: 8.2 mg/dL — ABNORMAL LOW (ref 8.9–10.3)
Chloride: 102 mmol/L (ref 98–111)
Creatinine, Ser: 0.65 mg/dL (ref 0.44–1.00)
GFR, Estimated: >60 mL/min (ref >60)
Glucose, Bld: 94 mg/dL (ref 70–99)
Potassium: 3.7 mmol/L (ref 3.5–5.1)
Sodium: 135 mmol/L (ref 135–145)
Total Bilirubin: 1 mg/dL (ref 0.0–1.2)
Total Protein: 6.1 g/dL — ABNORMAL LOW (ref 6.5–8.1)

## 2024-03-22 LAB — I-STAT CHEM 8, ED
BUN: 14 mg/dL (ref 6–20)
Calcium, Ion: 1.09 mmol/L — ABNORMAL LOW (ref 1.15–1.40)
Chloride: 102 mmol/L (ref 98–111)
Creatinine, Ser: 0.7 mg/dL (ref 0.44–1.00)
Glucose, Bld: 91 mg/dL (ref 70–99)
HCT: 34 % — ABNORMAL LOW (ref 36.0–46.0)
Hemoglobin: 11.6 g/dL — ABNORMAL LOW (ref 12.0–15.0)
Potassium: 3.2 mmol/L — ABNORMAL LOW (ref 3.5–5.1)
Sodium: 141 mmol/L (ref 135–145)
TCO2: 24 mmol/L (ref 22–32)

## 2024-03-22 LAB — HCG, SERUM, QUALITATIVE: Preg, Serum: NEGATIVE

## 2024-03-22 LAB — TROPONIN I (HIGH SENSITIVITY): Troponin I (High Sensitivity): 5 ng/L (ref <18)

## 2024-03-22 LAB — LIPASE, BLOOD: Lipase: 37 U/L (ref 11–51)

## 2024-03-22 MED ORDER — CHLOROPROCAINE HCL (PF) 2 % IJ SOLN
10.0000 mL | Freq: Once | INTRAMUSCULAR | Status: AC
  Administered 2024-03-22: 2 mL
  Filled 2024-03-22: qty 10

## 2024-03-22 MED ORDER — PROCHLORPERAZINE EDISYLATE 10 MG/2ML IJ SOLN
10.0000 mg | Freq: Once | INTRAMUSCULAR | Status: AC
  Administered 2024-03-22: 10 mg via INTRAVENOUS
  Filled 2024-03-22: qty 2

## 2024-03-22 MED ORDER — SODIUM CHLORIDE 0.9 % IV BOLUS
1000.0000 mL | Freq: Once | INTRAVENOUS | Status: AC
  Administered 2024-03-22: 1000 mL via INTRAVENOUS

## 2024-03-22 MED ORDER — KETOROLAC TROMETHAMINE 15 MG/ML IJ SOLN
15.0000 mg | Freq: Once | INTRAMUSCULAR | Status: AC
  Administered 2024-03-22: 15 mg via INTRAVENOUS
  Filled 2024-03-22: qty 1

## 2024-03-22 MED ORDER — TETANUS-DIPHTH-ACELL PERTUSSIS 5-2-15.5 LF-MCG/0.5 IM SUSP
0.5000 mL | Freq: Once | INTRAMUSCULAR | Status: AC
  Administered 2024-03-22: 0.5 mL via INTRAMUSCULAR
  Filled 2024-03-22: qty 0.5

## 2024-03-22 MED ORDER — ONDANSETRON 4 MG PO TBDP: 4.0000 mg | ORAL_TABLET | Freq: Four times a day (QID) | ORAL | 0 refills | Status: DC | PRN

## 2024-03-22 MED ORDER — ONDANSETRON HCL 4 MG/2ML IJ SOLN
4.0000 mg | Freq: Once | INTRAMUSCULAR | Status: AC
  Administered 2024-03-22: 4 mg via INTRAVENOUS
  Filled 2024-03-22: qty 2

## 2024-03-22 MED ORDER — IOHEXOL 350 MG/ML SOLN
65.0000 mL | Freq: Once | INTRAVENOUS | Status: AC | PRN
  Administered 2024-03-22: 65 mL via INTRAVENOUS

## 2024-03-22 MED ORDER — FENTANYL CITRATE (PF) 50 MCG/ML IJ SOSY
50.0000 ug | PREFILLED_SYRINGE | Freq: Once | INTRAMUSCULAR | Status: AC
  Administered 2024-03-22: 50 ug via INTRAVENOUS
  Filled 2024-03-22: qty 1

## 2024-03-22 NOTE — ED Notes (Signed)
 Patient transported to CT

## 2024-03-22 NOTE — Discharge Instructions (Addendum)
 You were examined today for a head injury and possible concussion.  Your CT scans did not show any signs of bleeding in the facial fractures.  Sometimes serious problems can develop after a head injury. Please return to the emergency department if you experience any of the following symptoms: Repeated vomiting Headache that gets worse and does not go away Loss of consciousness or inability to stay awake at times when you normally would be able to Getting more confused, restless or agitated Convulsions or seizures Difficulty walking or feeling off balance Weakness or numbness Vision changes  A concussion is a very mild traumatic brain injury caused by a bump, jolt or blow to the head, most people recover quickly and fully. You can experience a wide variety of symptoms including:   - Confusion      - Difficulty concentrating       - Trouble remembering new info  - Headache      - Dizziness        - Fuzzy or blurry vision  - Fatigue      - Balance problems      - Light sensitivity  - Mood swings     - Changes in sleep or difficulty sleeping   To help these symptoms improve make sure you are getting plenty of rest, avoid screen time, loud music and strenuous mental activities. Avoid any strenuous physical activities, once your symptoms have resolved a slow and gradual return to activity is recommended. It is very important that you avoid situations in which you might sustain a second head injury as this can be very dangerous and life threatening.   Your facial laceration was repaired with sutures 1. Medications: Tylenol  or ibuprofen for pain, usual home medications 2. Treatment: ice for swelling, keep wound clean with warm soap and water and keep bandage dry, do not submerge in water for 24 hours 3. Follow Up: Please return or follow up with primary care provider in 5-7 days to have your stitches/staples removed or sooner if you have concerns. Return to the emergency department for increased  redness, drainage of pus from the wound, or fevers/chills.   WOUND CARE  Keep area clean and dry for 24 hours. Do not remove bandage, if applied.  After 24 hours, remove bandage and wash wound gently with mild soap and warm water. Reapply a new bandage after cleaning wound, if directed.   Continue daily cleansing with soap and water until stitches/staples are removed.  Do not apply any ointments or creams to the wound while stitches/staples are in place, as this may cause delayed healing. Return if you experience any of the following signs of infection: Swelling, redness, pus drainage, streaking, fever >101.0 F  Return if you experience excessive bleeding that does not stop after 15-20 minutes of constant, firm pressure.

## 2024-03-22 NOTE — ED Notes (Signed)
EDP at bedside suturing pt 

## 2024-03-22 NOTE — ED Provider Notes (Signed)
°  [place] [facility] AT [facility] Provider Note   CSN: [id number] Arrival date & time: 03/22/24  [id number]     Patient presents with: Sandra Juarez is a 53 y.o. female.  {Add pertinent medical, surgical, social history, OB history to HPI:32947} Sandra Juarez is a 53 y.o. female    Fall       Prior to Admission medications  Medication Sig Start Date End Date Taking? Authorizing Provider  citalopram (CELEXA) 20 MG tablet Take 20 mg by mouth daily. 05/08/14   [provider]  EPINEPHrine  0.3 mg/0.3 mL IJ SOAJ injection Inject 0.3 mLs (0.3 mg total) into the muscle as needed for anaphylaxis. 06/21/19   Sandra Ronal RAMAN, MD  rizatriptan (MAXALT-MLT) 10 MG disintegrating tablet Take 1 tablet by mouth daily as needed.    [provider]  traMADol  (ULTRAM ) 50 MG tablet Take 1 tablet (50 mg total) by mouth every 6 (six) hours as needed for moderate pain (pain score 4-6) or severe pain (pain score 7-10). 02/13/24   Vernetta Berg, MD  traZODone  (DESYREL ) 50 MG tablet Take 1 tablet (50 mg total) by mouth at bedtime as needed for sleep. 02/28/24   Dohmeier, Dedra, MD  triamcinolone  cream (KENALOG ) 0.1 % Apply thin layer to skin in treatment area twice daily during radiation and for 2 weeks after. 03/19/24   Maritza Stagger, MD  triamcinolone  ointment (KENALOG ) 0.5 % Apply to affected area twice daily. 03/21/24   Maritza Stagger, MD  zolpidem (AMBIEN) 10 MG tablet Take 5-10 mg by mouth at bedtime as needed. 07/26/20   [provider]    Allergies: Lidocaine    Review of Systems  Updated Vital Signs BP 112/79   Pulse 66   Resp 18   SpO2 98%   Physical Exam  (all labs ordered are listed, but only abnormal results are displayed) Labs Reviewed - No data to display  EKG: None  Radiology: No results found.  {Document cardiac monitor, telemetry assessment procedure when appropriate:32947} Procedures   Medications  Ordered in the ED - No data to display    {Click here for ABCD2, HEART and other calculators REFRESH Note before signing:1}                              Medical Decision Making Amount and/or Complexity of Data Reviewed Labs: ordered. Radiology: ordered.  Risk Prescription drug management.   ***  {Document critical care time when appropriate  Document review of labs and clinical decision tools ie CHADS2VASC2, etc  Document your independent review of radiology images and any outside records  Document your discussion with family members, caretakers and with consultants  Document social determinants of health affecting pt's care  Document your decision making why or why not admission, treatments were needed:32947:::1}   Final diagnoses:  None    ED Discharge Orders     None

## 2024-03-22 NOTE — ED Triage Notes (Signed)
 Pt bibgcems from home , pt had syncopal episode while leaving the bathroom. Pt hit head on left side. One inch lac to left eyebrow, swelling to left eye and cheek. Positive loc , no thinners. 500 ml fluids with ems. Hx cancer and radiation  Bp 112/62 Hr 64 Cbg 115 99% ra

## 2024-03-23 ENCOUNTER — Other Ambulatory Visit: Payer: Self-pay

## 2024-03-23 ENCOUNTER — Ambulatory Visit: Admission: RE | Admit: 2024-03-23 | Discharge: 2024-03-23 | Attending: Radiation Oncology

## 2024-03-23 DIAGNOSIS — Z510 Encounter for antineoplastic radiation therapy: Secondary | ICD-10-CM | POA: Diagnosis not present

## 2024-03-23 LAB — RAD ONC ARIA SESSION SUMMARY
Course Elapsed Days: 7
Plan Fractions Treated to Date: 6
Plan Prescribed Dose Per Fraction: 2.67 Gy
Plan Total Fractions Prescribed: 15
Plan Total Prescribed Dose: 40.05 Gy
Reference Point Dosage Given to Date: 16.02 Gy
Reference Point Session Dosage Given: 2.67 Gy
Session Number: 6

## 2024-03-26 ENCOUNTER — Ambulatory Visit: Admission: RE | Admit: 2024-03-26 | Discharge: 2024-03-26 | Attending: Radiation Oncology

## 2024-03-26 ENCOUNTER — Other Ambulatory Visit: Payer: Self-pay

## 2024-03-26 DIAGNOSIS — Z510 Encounter for antineoplastic radiation therapy: Secondary | ICD-10-CM | POA: Diagnosis not present

## 2024-03-26 LAB — RAD ONC ARIA SESSION SUMMARY
Course Elapsed Days: 10
Plan Fractions Treated to Date: 7
Plan Prescribed Dose Per Fraction: 2.67 Gy
Plan Total Fractions Prescribed: 15
Plan Total Prescribed Dose: 40.05 Gy
Reference Point Dosage Given to Date: 18.69 Gy
Reference Point Session Dosage Given: 2.67 Gy
Session Number: 7

## 2024-03-27 ENCOUNTER — Other Ambulatory Visit: Payer: Self-pay

## 2024-03-27 ENCOUNTER — Ambulatory Visit: Admission: RE | Admit: 2024-03-27 | Discharge: 2024-03-27 | Attending: Radiation Oncology

## 2024-03-27 DIAGNOSIS — Z510 Encounter for antineoplastic radiation therapy: Secondary | ICD-10-CM | POA: Diagnosis not present

## 2024-03-27 LAB — RAD ONC ARIA SESSION SUMMARY
Course Elapsed Days: 11
Plan Fractions Treated to Date: 8
Plan Prescribed Dose Per Fraction: 2.67 Gy
Plan Total Fractions Prescribed: 15
Plan Total Prescribed Dose: 40.05 Gy
Reference Point Dosage Given to Date: 21.36 Gy
Reference Point Session Dosage Given: 2.67 Gy
Session Number: 8

## 2024-03-27 NOTE — ED Provider Notes (Incomplete)
 [place] EMERGENCY DEPARTMENT AT [facility] Provider Note   CSN: [id number] Arrival date & time: 03/22/24  9392     Patient presents with: Fall and Loss of Consciousness   Sandra Juarez is a 53 y.o. female.  {Add pertinent medical, surgical, social history, OB history to HPI:32947} Sandra Juarez is a 53 y.o. female with a history of breast cancer s/p lumpectomy currently undergoing radiation therapy, anxiety, depression, migraines, who presents to the emergency department via EMS for evaluation after syncopal episode.  Patient reports she woke up around 5 AM this morning due to stomach pain and feeling nauseated went to the bathroom and as she got up to leave the bathroom patient passed out.  Husband heard her fall and immediately came into the bathroom and she quickly came to.  Patient hit head and left side of face and reports a laceration above her left eyebrow and swelling and pain to the left cheek and jaw.  She reports pain when she tries to bite down but feels like her teeth fit together okay.  Patient complains of headache dizziness and nausea.  No vision changes but does report some light sensitivity.  She continues to complain of some generalized abdominal pain, had 1 episode of vomiting, nonbloody.  Denies any chest pain did not notice any heart palpitations or heart racing sensation, no shortness of breath.  No focal pain in her extremities, no numbness or weakness.  Patient is not on any blood thinners.  Currently undergoing radiation therapy for breast cancer, no current chemotherapy.  Patient does report that she has been on Ambien for many years but her neurologist stopped this and last night she tried trazodone  as a new sleep aid for the first time.  No other new medications.  The history is provided by the patient, the spouse, the EMS personnel and medical records.  Fall Associated symptoms include abdominal pain and headaches. Pertinent negatives  include no chest pain and no shortness of breath.       Prior to Admission medications  Medication Sig Start Date End Date Taking? Authorizing Provider  ondansetron  (ZOFRAN -ODT) 4 MG disintegrating tablet Take 1 tablet (4 mg total) by mouth every 6 (six) hours as needed for nausea or vomiting. 03/22/24  Yes Alva Mort F, PA-C  citalopram (CELEXA) 20 MG tablet Take 20 mg by mouth daily. 05/08/14   [provider]  EPINEPHrine  0.3 mg/0.3 mL IJ SOAJ injection Inject 0.3 mLs (0.3 mg total) into the muscle as needed for anaphylaxis. 06/21/19   Cleotilde Ronal RAMAN, MD  rizatriptan (MAXALT-MLT) 10 MG disintegrating tablet Take 1 tablet by mouth daily as needed.    [provider]  traMADol  (ULTRAM ) 50 MG tablet Take 1 tablet (50 mg total) by mouth every 6 (six) hours as needed for moderate pain (pain score 4-6) or severe pain (pain score 7-10). 02/13/24   Vernetta Berg, MD  traZODone  (DESYREL ) 50 MG tablet TAKE 1 TABLET BY MOUTH AT BEDTIME AS NEEDED FOR SLEEP. 03/27/24   Dohmeier, Dedra, MD  triamcinolone  cream (KENALOG ) 0.1 % Apply thin layer to skin in treatment area twice daily during radiation and for 2 weeks after. 03/19/24   Maritza Stagger, MD  triamcinolone  ointment (KENALOG ) 0.5 % Apply to affected area twice daily. 03/21/24   Maritza Stagger, MD  zolpidem (AMBIEN) 10 MG tablet Take 5-10 mg by mouth at bedtime as needed. 07/26/20   [provider]    Allergies: Lidocaine    Review  of Systems  Constitutional:  Negative for chills and fever.  HENT: Negative.    Eyes:  Positive for photophobia. Negative for visual disturbance.  Respiratory:  Negative for cough and shortness of breath.   Cardiovascular:  Negative for chest pain and palpitations.  Gastrointestinal:  Positive for abdominal pain, nausea and vomiting.  Musculoskeletal:  Negative for arthralgias, back pain and myalgias.  Neurological:  Positive for dizziness, syncope and headaches. Negative for seizures,  weakness and numbness.    Updated Vital Signs BP 110/60   Pulse 64   Temp 98.6 F (37 C) (Oral)   Resp 16   SpO2 100%   Physical Exam Vitals and nursing note reviewed.  Constitutional:      General: She is not in acute distress.    Appearance: Normal appearance. She is well-developed. She is not diaphoretic.     Comments: Alert, appears uncomfortable but in no acute distress  HENT:     Head: Normocephalic.     Comments: Swelling and tenderness noted to the left side of the face, no hematoma, step-off or deformity over the scalp, negative Battle sign, no raccoon eyes 1 inch laceration above the left eyebrow without palpable bony step-off, bleeding controlled    Ears:     Comments: No hemotympanum bilaterally    Nose:     Comments: No deformity or swelling over the nose, no CSF otorrhea    Mouth/Throat:     Mouth: Mucous membranes are moist.     Pharynx: Oropharynx is clear.     Comments: Tenderness and pain over the left jaw without obvious malocclusion or deformity, no broken or loose teeth, no tongue bite Eyes:     General:        Right eye: No discharge.        Left eye: No discharge.     Extraocular Movements: Extraocular movements intact.     Conjunctiva/sclera: Conjunctivae normal.     Pupils: Pupils are equal, round, and reactive to light.  Neck:     Comments: C-collar in place no focal midline tenderness or step-off Cardiovascular:     Rate and Rhythm: Normal rate and regular rhythm.     Pulses: Normal pulses.     Heart sounds: Normal heart sounds.  Pulmonary:     Effort: Pulmonary effort is normal. No respiratory distress.     Breath sounds: Normal breath sounds. No wheezing or rales.     Comments: Respirations equal and unlabored, patient able to speak in full sentences, lungs clear to auscultation bilaterally, no chest wall tenderness Chest:     Chest wall: No tenderness.  Abdominal:     General: Bowel sounds are normal. There is no distension.      Palpations: Abdomen is soft. There is no mass.     Tenderness: There is abdominal tenderness. There is no guarding.     Comments: Abdomen soft, nondistended, bowel sounds present throughout, very mild generalized abdominal tenderness without focal tenderness in 1 quadrant, no peritoneal signs.  Musculoskeletal:        General: No deformity.     Cervical back: Neck supple.     Comments: No midline thoracic or lumbar spine tenderness, all joints supple and easily movable in all compartments soft  Skin:    General: Skin is warm and dry.     Capillary Refill: Capillary refill takes less than 2 seconds.  Neurological:     Mental Status: She is alert and oriented to person, place, and time.  Coordination: Coordination normal.     Comments: Neurological Exam:  Mental Status: Alert and oriented to person, place, and time. Attention and concentration normal. Speech clear. Recent memory is intact. Follows commands. Cranial Nerves: Visual fields grossly intact. EOMI and PERRLA. No nystagmus noted. Facial sensation intact at forehead, maxillary cheek, and chin/mandible bilaterally. No facial asymmetry or weakness. Hearing grossly normal. Uvula is midline, and palate elevates symmetrically. Normal SCM and trapezius strength. Tongue midline without fasciculations. Motor: Muscle strength 5/5 in proximal and distal UE and LE bilaterally. No pronator drift. Muscle tone normal. Sensation: Intact to light touch in upper and lower extremities distally bilaterally.  Coordination: Normal FTN bilaterally.   Psychiatric:        Mood and Affect: Mood normal.        Behavior: Behavior normal.     (all labs ordered are listed, but only abnormal results are displayed) Labs Reviewed  COMPREHENSIVE METABOLIC PANEL WITH GFR - Abnormal; Notable for the following components:      Result Value   Calcium 8.2 (*)    Total Protein 6.1 (*)    Alkaline Phosphatase 28 (*)    All other components within normal limits   I-STAT CHEM 8, ED - Abnormal; Notable for the following components:   Potassium 3.2 (*)    Calcium, Ion 1.09 (*)    Hemoglobin 11.6 (*)    HCT 34.0 (*)    All other components within normal limits  CBC WITH DIFFERENTIAL/PLATELET  LIPASE, BLOOD  HCG, SERUM, QUALITATIVE  MAGNESIUM  TROPONIN I (HIGH SENSITIVITY)    EKG: EKG Interpretation Date/Time:  Thursday March 22 2024 06:08:47 EST Ventricular Rate:  68 PR Interval:  201 QRS Duration:  121 QT Interval:  445 QTC Calculation: 474 R Axis:   70  Text Interpretation: Sinus rhythm IVCD, consider atypical RBBB Nonspecific T abnormalities, lateral leads Confirmed by Yolande Charleston [phone]) on 03/22/2024 7:08:58 AM  Radiology: CT ABDOMEN PELVIS W CONTRAST Result Date: 03/22/2024 EXAM: CT Abdomen and Pelvis With Intravenous Contrast CLINICAL HISTORY: 53 year old female with syncope and fall at home, striking left side. TECHNIQUE: Axial computed tomography images of the abdomen and pelvis with intravenous contrast. Dose reduction technique was used including one or more of the following: automated exposure control, adjustment of mA and kV according to patient size, and/or iterative reconstruction. CONTRAST: 65 mL iohexol  (OMNIPAQUE ) 350 MG/ML injection. COMPARISON: Abdominal ultrasound 04/20/2007. FINDINGS: LUNG BASES: No basilar airspace consolidation or pleural effusion. LIVER: Unremarkable. GALLBLADDER AND BILE DUCTS: Unremarkable. No calcified stone. No ductal dilation. PANCREAS: Unremarkable. SPLEEN: Unremarkable. ADRENAL GLANDS: Unremarkable. KIDNEYS, URETERS, AND BLADDER: Unremarkable. No hydronephrosis or nephrolithiasis. No ureteral or bladder calculi. STOMACH AND BOWEL: Gas filled but otherwise negative rectum. Decompressed proximal large bowel, including normal appendix on series 3 image 51. Retained stool in the descending and sigmoid colon. No dilated bowel loops. APPENDIX: Normal appendix on series 3 image 51. No CT evidence for  appendicitis. PERITONEUM: Small volume of free fluid in the right cul de sac with simple fluid density, likely physiologic on series 3 image 67. No free air. LYMPH NODES: No lymphadenopathy. REPRODUCTIVE: Negative. VASCULATURE: Major arterial structures and portal venous system in the abdomen and pelvis appear patent and normal. No aortic aneurysm. Incidental pelvic phleboliths. ABDOMINAL WALL AND SOFT TISSUES: Unremarkable. BONES: No fracture or suspicious osseous abnormality. IMPRESSION: 1. No acute traumatic injury identified in the abdomen or pelvis; small volume free fluid in the cul-de-sac is most likely physiologic. Electronically signed by: Helayne Hurst MD  03/22/2024 08:46 AM EST RP Workstation: [id number]   CT Cervical Spine Wo Contrast Result Date: 03/22/2024 EXAM: CT Cervical Spine Without Intravenous Contrast. CLINICAL HISTORY: 53 year old female with neck trauma, midline tenderness, syncope, and fall at home, striking left side of head. TECHNIQUE: Axial computed tomography images of the cervical spine without intravenous contrast. Sagittal and coronal reformations performed. Dose reduction technique was used including one or more of the following: automated exposure control, adjustment of mA and kV according to patient size, and/or iterative reconstruction. COMPARISON: CT head and face reported separately today. Prior cervical spine MRI 09/27/2020. FINDINGS: BONES: Improved cervical lordosis compared to the previous MRI. No acute fracture or focal osseous lesion. Bony alignment is anatomic. DEGENERATIVE CHANGES: Intermittent cervical spine degeneration. Evidence of mild associated cervical spinal stenosis, similar to the previous MRI. SOFT TISSUES: No prevertebral soft tissue swelling. Mild apical lung scarring but otherwise negative visible non-contrast thoracic inlet. IMPRESSION: 1. No acute traumatic injury identified in the cervical spine. 2. Chronic cervical spine degeneration similar to the  previous MRI. Electronically signed by: Helayne Hurst MD 03/22/2024 08:40 AM EST RP Workstation: [id number]   CT Maxillofacial WO CM Result Date: 03/22/2024 EXAM: CT Maxillofacial without Intravenous Contrast. CLINICAL HISTORY: 53 year old female with syncope and fall at home, striking left side of head. TECHNIQUE: Axial computed tomography images of the face without intravenous contrast. Sagittal and coronal reformations performed. Dose reduction technique was used including one or more of the following: automated exposure control, adjustment of mA and kV according to patient size, and/or iterative reconstruction. CONTRAST: None. COMPARISON: CT head and cervical spine reported separately today. FINDINGS: BONES: No acute fracture or focal osseous lesion. The mandible is intact. SOFT TISSUES: Soft tissue swelling and stranding overlying the left zygomaticomaxillary confluence. Subcutaneous mild hematoma / contusion there (series 5 image 59). No posttraumatic soft tissue gas. Negative visible non-contrast larynx, pharynx, parapharyngeal spaces, retropharyngeal space, sublingual space, submandibular spaces, masticator and parotid spaces. SINUSES: The paranasal sinuses, visible tympanic cavities, and mastoids are clear. ORBITS: Intact orbit walls. Globes and intraorbital soft tissues appear symmetric and normal. No retrobulbar hematoma or mass. IMPRESSION: 1. Superficial soft tissue injury over the left zygomaticomaxillary confluence. 2. No facial fracture identified. Electronically signed by: Helayne Hurst MD 03/22/2024 08:38 AM EST RP Workstation: [id number]   CT Head Wo Contrast Result Date: 03/22/2024 EXAM: CT Head Without Intravenous Contrast. CLINICAL HISTORY: 53 year old female with syncope and fall at home, striking left side of head. TECHNIQUE: Axial computed tomography images of the head/brain without intravenous contrast. Dose reduction technique was used including one or more of the following: automated  exposure control, adjustment of mA and kV according to patient size, and/or iterative reconstruction. CONTRAST: Without. COMPARISON: Previous head CT 02/01/2009. CT face reported separately today. FINDINGS: BRAIN: Brain volume remains normal. No acute intraparenchymal hemorrhage. No mass lesion. No CT evidence for acute territorial infarct. No midline shift or extra-axial collection. Gray white differentiation is normal for age. No suspicious intracranial vascular hyperdensity. VENTRICLES: No hydrocephalus. ORBITS: The orbits are unremarkable. SINUSES AND MASTOIDS: Paranasal sinuses, tympanic cavities and mastoids well aerated. SOFT TISSUES: Left supraorbital mild soft tissue swelling and stranding on series 4 image 38. Underlying left frontal bone intact. Additional left face soft tissue swelling reported separately. No radiopaque foreign body is seen. BONES: No acute skull fracture. IMPRESSION: 1. Left supraorbital scalp soft tissue injury.  Face CT reported separately. 2. Normal for age non-contrast CT appearance of the brain. Electronically signed by: Helayne Hurst MD 03/22/2024 08:34 AM  EST RP Workstation: [id number]     {Document cardiac monitor, telemetry assessment procedure when appropriate:32947} .Laceration Repair  Date/Time: 03/28/2024 12:00 AM  Performed by: Alva Larraine FALCON, PA-C Authorized by: Alva Larraine FALCON, PA-C   Consent:    Consent obtained:  Verbal   Risks, benefits, and alternatives were discussed: yes     Risks discussed:  Infection, pain, poor cosmetic result and poor wound healing   Alternatives discussed:  No treatment Universal protocol:    Procedure explained and questions answered to patient or proxy's satisfaction: yes     Patient identity confirmed:  Verbally with patient Anesthesia:    Anesthesia method:  Local infiltration   Local anesthetic: Chloroprocaine  2% Laceration details:    Location:  Face   Face location:  L eyebrow   Length (cm):  3   Depth (mm):   5 Pre-procedure details:    Preparation:  Patient was prepped and draped in usual sterile fashion Exploration:    Hemostasis achieved with:  Direct pressure   Wound exploration: wound explored through full range of motion and entire depth of wound visualized     Wound extent: areolar tissue violated     Wound extent: no underlying fracture   Treatment:    Area cleansed with:  Saline   Amount of cleaning:  Standard   Irrigation solution:  Sterile saline   Debridement:  None Skin repair:    Repair method:  Sutures   Suture size:  6-0   Suture material:  Prolene   Suture technique:  Simple interrupted   Number of sutures:  5 Approximation:    Approximation:  Close Repair type:    Repair type:  Simple Post-procedure details:    Dressing:  Open (no dressing)   Procedure completion:  Tolerated well, no immediate complications    Medications Ordered in the ED  ondansetron  (ZOFRAN ) injection 4 mg (4 mg Intravenous Given 03/22/24 0704)  sodium chloride  0.9 % bolus 1,000 mL (0 mLs Intravenous Stopped 03/22/24 1006)  fentaNYL  (SUBLIMAZE ) injection 50 mcg (50 mcg Intravenous Given 03/22/24 0705)  Tdap (ADACEL ) injection 0.5 mL (0.5 mLs Intramuscular Given 03/22/24 1007)  chloroprocaine  (PF) (NESACAINE -MPF) 2 % injection 10 mL (2 mLs Infiltration Given 03/22/24 1006)  iohexol  (OMNIPAQUE ) 350 MG/ML injection 65 mL (65 mLs Intravenous Contrast Given 03/22/24 0822)  prochlorperazine  (COMPAZINE ) injection 10 mg (10 mg Intravenous Given 03/22/24 1002)  ketorolac  (TORADOL ) 15 MG/ML injection 15 mg (15 mg Intravenous Given 03/22/24 0959)      {Click here for ABCD2, HEART and other calculators REFRESH Note before signing:1}                              Medical Decision Making Amount and/or Complexity of Data Reviewed Labs: ordered. Radiology: ordered.  Risk Prescription drug management.   Patient presents after syncopal episode that occurred after she woke up with abdominal pain and  nausea.  Clemens striking the left side of her face and head.  Given syncope was preceded by pain and nausea I have high clinical suspicion for vasovagal episode but differential also includes arrhythmia, electrolyte derangement, intracranial hemorrhage, medication reaction, seizure.  EKG here shows normal sinus rhythm without ischemic changes, possible right bundle branch block.  Cardiac monitoring performed throughout patient's ED stay with no evidence of arrhythmia.  Laboratory evaluation overall reassuring with no leukocytosis and normal hemoglobin, no significant electrolyte derangements, normal magnesium and lipase, normal troponins.  CT imaging of  the head, cervical spine, maxillofacial bones as well as abdomen pelvis obtained in the setting of syncope with head and facial injury that was preceded by abdominal pain.  Imaging and interpreted independently, agree with radiologist findings, fortunately no intracranial hemorrhage or fracture, no C-spine fracture  {Document critical care time when appropriate  Document review of labs and clinical decision tools ie CHADS2VASC2, etc  Document your independent review of radiology images and any outside records  Document your discussion with family members, caretakers and with consultants  Document social determinants of health affecting pt's care  Document your decision making why or why not admission, treatments were needed:32947:::1}   Final diagnoses:  Syncope, unspecified syncope type  Facial laceration, initial encounter  Contusion of face, initial encounter  Concussion with loss of consciousness of 30 minutes or less, initial encounter    ED Discharge Orders          Ordered    ondansetron  (ZOFRAN -ODT) 4 MG disintegrating tablet  Every 6 hours PRN        03/22/24 1233

## 2024-03-28 ENCOUNTER — Other Ambulatory Visit: Payer: Self-pay

## 2024-03-28 ENCOUNTER — Ambulatory Visit: Admission: RE | Admit: 2024-03-28 | Discharge: 2024-03-28 | Attending: Radiation Oncology

## 2024-03-28 DIAGNOSIS — Z510 Encounter for antineoplastic radiation therapy: Secondary | ICD-10-CM | POA: Diagnosis not present

## 2024-03-28 LAB — RAD ONC ARIA SESSION SUMMARY
Course Elapsed Days: 12
Plan Fractions Treated to Date: 9
Plan Prescribed Dose Per Fraction: 2.67 Gy
Plan Total Fractions Prescribed: 15
Plan Total Prescribed Dose: 40.05 Gy
Reference Point Dosage Given to Date: 24.03 Gy
Reference Point Session Dosage Given: 2.67 Gy
Session Number: 9

## 2024-03-29 ENCOUNTER — Ambulatory Visit: Admission: RE | Admit: 2024-03-29 | Discharge: 2024-03-29 | Attending: Radiation Oncology

## 2024-03-29 ENCOUNTER — Other Ambulatory Visit: Payer: Self-pay

## 2024-03-29 DIAGNOSIS — Z510 Encounter for antineoplastic radiation therapy: Secondary | ICD-10-CM | POA: Diagnosis not present

## 2024-03-29 LAB — RAD ONC ARIA SESSION SUMMARY
Course Elapsed Days: 13
Plan Fractions Treated to Date: 10
Plan Prescribed Dose Per Fraction: 2.67 Gy
Plan Total Fractions Prescribed: 15
Plan Total Prescribed Dose: 40.05 Gy
Reference Point Dosage Given to Date: 26.7 Gy
Reference Point Session Dosage Given: 2.67 Gy
Session Number: 10

## 2024-03-30 ENCOUNTER — Other Ambulatory Visit: Payer: Self-pay

## 2024-03-30 ENCOUNTER — Ambulatory Visit: Admission: RE | Admit: 2024-03-30 | Discharge: 2024-03-30 | Attending: Radiation Oncology

## 2024-03-30 DIAGNOSIS — Z510 Encounter for antineoplastic radiation therapy: Secondary | ICD-10-CM | POA: Diagnosis not present

## 2024-03-30 LAB — RAD ONC ARIA SESSION SUMMARY
Course Elapsed Days: 14
Plan Fractions Treated to Date: 11
Plan Prescribed Dose Per Fraction: 2.67 Gy
Plan Total Fractions Prescribed: 15
Plan Total Prescribed Dose: 40.05 Gy
Reference Point Dosage Given to Date: 29.37 Gy
Reference Point Session Dosage Given: 2.67 Gy
Session Number: 11

## 2024-04-01 ENCOUNTER — Ambulatory Visit: Admission: RE | Admit: 2024-04-01 | Discharge: 2024-04-01 | Attending: Radiation Oncology

## 2024-04-01 ENCOUNTER — Other Ambulatory Visit: Payer: Self-pay

## 2024-04-01 DIAGNOSIS — Z510 Encounter for antineoplastic radiation therapy: Secondary | ICD-10-CM | POA: Diagnosis not present

## 2024-04-01 LAB — RAD ONC ARIA SESSION SUMMARY
Course Elapsed Days: 16
Plan Fractions Treated to Date: 12
Plan Prescribed Dose Per Fraction: 2.67 Gy
Plan Total Fractions Prescribed: 15
Plan Total Prescribed Dose: 40.05 Gy
Reference Point Dosage Given to Date: 32.04 Gy
Reference Point Session Dosage Given: 2.67 Gy
Session Number: 12

## 2024-04-02 ENCOUNTER — Ambulatory Visit: Admission: RE | Admit: 2024-04-02 | Discharge: 2024-04-02 | Attending: Radiation Oncology

## 2024-04-02 ENCOUNTER — Other Ambulatory Visit: Payer: Self-pay

## 2024-04-02 DIAGNOSIS — Z510 Encounter for antineoplastic radiation therapy: Secondary | ICD-10-CM | POA: Diagnosis not present

## 2024-04-02 LAB — RAD ONC ARIA SESSION SUMMARY
Course Elapsed Days: 17
Plan Fractions Treated to Date: 13
Plan Prescribed Dose Per Fraction: 2.67 Gy
Plan Total Fractions Prescribed: 15
Plan Total Prescribed Dose: 40.05 Gy
Reference Point Dosage Given to Date: 34.71 Gy
Reference Point Session Dosage Given: 2.67 Gy
Session Number: 13

## 2024-04-03 ENCOUNTER — Ambulatory Visit
Admission: RE | Admit: 2024-04-03 | Discharge: 2024-04-03 | Disposition: A | Discharge status: Home / Self Care | Source: Ambulatory Visit | Attending: Radiation Oncology | Admitting: Radiation Oncology

## 2024-04-03 ENCOUNTER — Other Ambulatory Visit: Payer: Self-pay

## 2024-04-03 DIAGNOSIS — Z510 Encounter for antineoplastic radiation therapy: Secondary | ICD-10-CM | POA: Diagnosis not present

## 2024-04-03 LAB — RAD ONC ARIA SESSION SUMMARY
Course Elapsed Days: 18
Plan Fractions Treated to Date: 14
Plan Prescribed Dose Per Fraction: 2.67 Gy
Plan Total Fractions Prescribed: 15
Plan Total Prescribed Dose: 40.05 Gy
Reference Point Dosage Given to Date: 37.38 Gy
Reference Point Session Dosage Given: 2.67 Gy
Session Number: 14

## 2024-04-04 ENCOUNTER — Other Ambulatory Visit: Payer: Self-pay

## 2024-04-04 ENCOUNTER — Ambulatory Visit
Admission: RE | Admit: 2024-04-04 | Discharge: 2024-04-04 | Disposition: A | Source: Ambulatory Visit | Attending: Radiation Oncology

## 2024-04-04 DIAGNOSIS — Z510 Encounter for antineoplastic radiation therapy: Secondary | ICD-10-CM | POA: Diagnosis not present

## 2024-04-04 LAB — RAD ONC ARIA SESSION SUMMARY
Course Elapsed Days: 19
Plan Fractions Treated to Date: 15
Plan Prescribed Dose Per Fraction: 2.67 Gy
Plan Total Fractions Prescribed: 15
Plan Total Prescribed Dose: 40.05 Gy
Reference Point Dosage Given to Date: 40.05 Gy
Reference Point Session Dosage Given: 2.67 Gy
Session Number: 15

## 2024-04-09 ENCOUNTER — Ambulatory Visit

## 2024-04-09 NOTE — Radiation Completion Notes (Signed)
 Patient Name: Sandra Juarez, Sandra Juarez MRN: [id number] Date of Birth: 1970-04-14 Referring Physician: LEITA BLIND, M.D. Date of Service: 2024-04-09 Radiation Oncologist: Estefana Cha, M.D. 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 Staging on 2024-01-23: Ductal carcinoma in situ (DCIS) of right breast T=cTis (DCIS), N=cN0, M=cM0 Intent: Curative     ==========DELIVERED PLANS==========  First Treatment Date: 2024-03-16 Last Treatment Date: 2024-04-04   Plan Name: Breast_R Site: Breast, Right Technique: 3D Mode: Photon Dose Per Fraction: 2.67 Gy Prescribed Dose (Delivered / Prescribed): 40.05 Gy / 40.05 Gy Prescribed Fxs (Delivered / Prescribed): 15 / 15     ==========ON TREATMENT VISIT DATES========== 2024-03-19, 2024-03-21, 2024-03-28, 2024-04-04     ==========UPCOMING VISITS========== 06/27/2024 [facility] NEURO OFFICE VISIT 30 Dohmeier, Dedra, MD  05/07/2024 [facility] ONC FOLLOW UP 20 Cha Estefana, MD  04/28/2024 [facility]315 MRI MR BRAIN WWO CONTRAST-GI GI-315 MR 3  04/17/2024 [facility] ONCOLOGY EST PT 15 Odean Potts, MD        ==========APPENDIX - ON TREATMENT VISIT NOTES==========   See weekly On Treatment Notes in Epic for details in the Media tab (listed as Progress notes on the On Treatment Visit Dates listed above).

## 2024-04-11 ENCOUNTER — Telehealth: Payer: Self-pay | Admitting: Radiation Oncology

## 2024-04-11 ENCOUNTER — Other Ambulatory Visit: Payer: Self-pay | Admitting: Radiation Oncology

## 2024-04-11 DIAGNOSIS — D0511 Intraductal carcinoma in situ of right breast: Secondary | ICD-10-CM

## 2024-04-11 NOTE — Telephone Encounter (Signed)
 12/31 Outgoing Referral faxed to Dr. Jordan, [facility].

## 2024-04-17 ENCOUNTER — Telehealth: Payer: Self-pay | Admitting: Neurology

## 2024-04-17 ENCOUNTER — Inpatient Hospital Stay: Payer: Self-pay | Attending: Hematology and Oncology | Admitting: Hematology and Oncology

## 2024-04-17 VITALS — BP 98/68 | HR 66 | Temp 98.1°F | Resp 16 | Ht 67.0 in | Wt 128.6 lb

## 2024-04-17 DIAGNOSIS — Z1721 Progesterone receptor positive status: Secondary | ICD-10-CM | POA: Insufficient documentation

## 2024-04-17 DIAGNOSIS — Z9181 History of falling: Secondary | ICD-10-CM | POA: Insufficient documentation

## 2024-04-17 DIAGNOSIS — Z79899 Other long term (current) drug therapy: Secondary | ICD-10-CM | POA: Insufficient documentation

## 2024-04-17 DIAGNOSIS — Z170 Estrogen receptor positive status [ER+]: Secondary | ICD-10-CM | POA: Diagnosis not present

## 2024-04-17 DIAGNOSIS — Z923 Personal history of irradiation: Secondary | ICD-10-CM | POA: Diagnosis not present

## 2024-04-17 DIAGNOSIS — D0511 Intraductal carcinoma in situ of right breast: Secondary | ICD-10-CM | POA: Diagnosis not present

## 2024-04-17 MED ORDER — TAMOXIFEN CITRATE 10 MG PO TABS: 5.0000 mg | ORAL_TABLET | Freq: Every day | ORAL | 1 refills | Status: DC

## 2024-04-17 NOTE — Assessment & Plan Note (Addendum)
 01/23/2024:2-year follow-up of calcifications (previous benign right breast biopsy): New linear branching calcifications spanning 7 mm stereotactic biopsy: Intermediate grade DCIS with necrosis and calcifications ER 95%, PR 99%   02/13/2024: Right lumpectomy: Grade 2 DCIS with necrosis 8 mm, margins negative, ER 95%, PR 99% 03/19/2024-04/04/2024: Adjuvant radiation  Treatment plan: low-dose tamoxifen  5 years to start 04/18/2023  Tamoxifen  counseling: We discussed the risks and benefits of tamoxifen . These include but not limited to insomnia, hot flashes, mood changes, vaginal dryness, and weight gain. Although rare, serious side effects including endometrial cancer, risk of blood clots were also discussed. We strongly believe that the benefits far outweigh the risks. Patient understands these risks and consented to starting treatment. Planned treatment duration is 5 years.   Return to clinic in 3 months for survivorship care plan visit

## 2024-04-17 NOTE — Telephone Encounter (Signed)
 Pt called to request  appt the patient fell and  hit her head while taking medication traZODone  (DESYREL ) 50 MG tablet  . Pt stated that   medication made her dizzy she feel out and hit her head not  Pt oncologist  is requesting for Pt to be seen sooner

## 2024-04-17 NOTE — Progress Notes (Signed)
 "  Patient Care Team: Stephane [name], MD as PCP - General (Internal Medicine) Gerome, Devere HERO, RN as Oncology Nurse Navigator Tyree Nanetta SAILOR, RN as Oncology Nurse Navigator Vernetta Berg, MD as Consulting Physician (General Surgery) Odean Potts, MD as Consulting Physician (Hematology and Oncology) Maritza Stagger, MD as Consulting Physician (Radiation Oncology)  DIAGNOSIS:  Encounter Diagnosis  Name Primary?   Ductal carcinoma in situ (DCIS) of right breast Yes    SUMMARY OF ONCOLOGIC HISTORY: Oncology History  Ductal carcinoma in situ (DCIS) of right breast  01/23/2024 Initial Diagnosis   2-year follow-up of calcifications (previous benign right breast biopsy): New linear branching calcifications spanning 7 mm stereotactic biopsy: Intermediate grade DCIS with necrosis and calcifications ER 95%, PR 99%   01/23/2024 Cancer Staging   Staging form: Breast, AJCC 8th Edition - Clinical stage from 01/23/2024: Stage 0 (cTis (DCIS), cN0, cM0, ER+, PR+, HER2: Not Assessed) - Signed by Odean Potts, MD on 02/01/2024 Stage prefix: Initial diagnosis Nuclear grade: G2   02/11/2024 Genetic Testing   Negative genetic testing. No pathogenic variants identified on the [employer] Cancer[facility]+RNA Panel. The report date is 02/11/2024.  The [employer] Cancer[employer]+RNAinsight Panel includes sequencing, rearrangement analysis, and RNA analysis for the following 40 genes: APC, ATM, BAP1, BARD1, BMPR1A, BRCA1, BRCA2, BRIP1, CDH1, CDKN2A, CHEK2, FH, FLCN, MET, MLH1, MSH2, MSH6, MUTYH, NF1, NTHL1, PALB2, PMS2, PTEN, RAD51C, RAD51D, RPS20, SMAD4, STK11, TP53, TSC1, TSC2, and VHL (sequencing and deletion/duplication); AXIN2, HOXB13, MBD4, MSH3, POLD1 and POLE (sequencing only); EPCAM and GREM1 (deletion/duplication only).     CHIEF COMPLIANT: Follow-up after radiation is complete  HISTORY OF PRESENT ILLNESS:   History of Present Illness Sandra Juarez is a 54 year old female with ER/PR-positive ductal  carcinoma in situ of the right breast who presents for oncology follow-up regarding persistent post-radiation skin changes and ongoing concussion symptoms prior to planned initiation of adjuvant tamoxifen .  She completed lumpectomy and adjuvant whole breast radiation for a 7 mm, grade 2, ER/PR-positive ductal carcinoma in situ of the right breast without invasive disease or known recurrence. Since the first day of radiation she has had persistent atypical bumps over the right breast that have not resolved and do not resemble typical radiation dermatitis. Dermatology started a new topical treatment but the skin changes are unchanged.  About one week after starting radiation she had a syncopal episode with a fall and concussion requiring sutures and hospitalization. Since then she has severe headaches, persistent unilateral head pain, blurred vision, and memory concerns that feel different from her prior migraines. She has a brain MRI scheduled later this month. She uses acetaminophen  with minimal relief, has no benefit from caffeine, and has not recently used her prescription migraine medication. She is interested in over-the-counter migraine supplements such as MIG Relief.  She has not started tamoxifen  and asked about its effects on her menstrual cycle, as she still has regular menses. Risks and benefits of tamoxifen  were reviewed, including recurrence reduction, potential side effects, and need to monitor for abnormal uterine bleeding. She does not take other supplements, drinks one to two cups of coffee daily, and asked about the impact of alcohol consumption on breast cancer risk.      ALLERGIES:  is allergic to lidocaine.  MEDICATIONS:  Current Outpatient Medications  Medication Sig Dispense Refill   citalopram (CELEXA) 20 MG tablet Take 20 mg by mouth daily.  3   EPINEPHrine  0.3 mg/0.3 mL IJ SOAJ injection Inject 0.3 mLs (0.3 mg total) into the muscle as  needed for anaphylaxis. 1 each 1    ondansetron  (ZOFRAN -ODT) 4 MG disintegrating tablet Take 1 tablet (4 mg total) by mouth every 6 (six) hours as needed for nausea or vomiting. 20 tablet 0   rizatriptan (MAXALT-MLT) 10 MG disintegrating tablet Take 1 tablet by mouth daily as needed.     traMADol  (ULTRAM ) 50 MG tablet Take 1 tablet (50 mg total) by mouth every 6 (six) hours as needed for moderate pain (pain score 4-6) or severe pain (pain score 7-10). 20 tablet 0   traZODone  (DESYREL ) 50 MG tablet TAKE 1 TABLET BY MOUTH AT BEDTIME AS NEEDED FOR SLEEP. 90 tablet 2   triamcinolone  cream (KENALOG ) 0.1 % Apply thin layer to skin in treatment area twice daily during radiation and for 2 weeks after. 454 g 0   triamcinolone  ointment (KENALOG ) 0.5 % Apply to affected area twice daily. 30 g 0   zolpidem (AMBIEN) 10 MG tablet Take 5-10 mg by mouth at bedtime as needed.     No current facility-administered medications for this visit.    PHYSICAL EXAMINATION: ECOG PERFORMANCE STATUS: 1 - Symptomatic but completely ambulatory  Vitals:   04/17/24 1041  BP: 98/68  Pulse: 66  Resp: 16  Temp: 98.1 F (36.7 C)  SpO2: 100%   Filed Weights   04/17/24 1041  Weight: 128 lb 9.6 oz (58.3 kg)      LABORATORY DATA:  I have reviewed the data as listed    Latest Ref Rng & Units 03/22/2024    7:06 AM 03/22/2024    6:15 AM 02/28/2024   11:17 AM  CMP  Glucose 70 - 99 mg/dL 91  94  79   BUN 6 - 20 mg/dL 14  15  17    Creatinine 0.44 - 1.00 mg/dL 9.29  9.34  9.29   Sodium 135 - 145 mmol/L 141  135  140   Potassium 3.5 - 5.1 mmol/L 3.2  3.7  4.4   Chloride 98 - 111 mmol/L 102  102  100   CO2 22 - 32 mmol/L  26  27   Calcium 8.9 - 10.3 mg/dL  8.2  9.9   Total Protein 6.5 - 8.1 g/dL  6.1  6.9   Total Bilirubin 0.0 - 1.2 mg/dL  1.0  0.3   Alkaline Phos 38 - 126 U/L  28  37   AST 15 - 41 U/L  24  12   ALT 0 - 44 U/L  16  12     Lab Results  Component Value Date   WBC 7.0 03/22/2024   HGB 11.6 (L) 03/22/2024   HCT 34.0 (L) 03/22/2024    MCV 93.3 03/22/2024   PLT 201 03/22/2024   NEUTROABS 4.9 03/22/2024    ASSESSMENT & PLAN:  Ductal carcinoma in situ (DCIS) of right breast 01/23/2024:2-year follow-up of calcifications (previous benign right breast biopsy): New linear branching calcifications spanning 7 mm stereotactic biopsy: Intermediate grade DCIS with necrosis and calcifications ER 95%, PR 99%   02/13/2024: Right lumpectomy: Grade 2 DCIS with necrosis 8 mm, margins negative, ER 95%, PR 99% 03/19/2024-04/04/2024: Adjuvant radiation  Treatment plan: low-dose tamoxifen  5 years to start 06/10/2024  Tamoxifen  counseling: We discussed the risks and benefits of tamoxifen . These include but not limited to insomnia, hot flashes, mood changes, vaginal dryness, and weight gain. Although rare, serious side effects including endometrial cancer, risk of blood clots were also discussed. We strongly believe that the benefits far outweigh the risks. Patient understands  these risks and consented to starting treatment. Planned treatment duration is 5 years.  Recent fall and concussion: Headaches since last month.  I told her to delay starting tamoxifen  until June 10, 2024. Return to clinic in 3 months for survivorship care plan visit    No orders of the defined types were placed in this encounter.  The patient has a good understanding of the overall plan. she agrees with it. she will call with any problems that may develop before the next visit here.  I personally spent a total of 30 minutes in the care of the patient today including preparing to see the patient, getting/reviewing separately obtained history, performing a medically appropriate exam/evaluation, counseling and educating, placing orders, referring and communicating with other health care professionals, documenting clinical information in the EHR, independently interpreting results, communicating results, and coordinating care.   Viinay K Zadrian Mccauley, MD 04/17/2024    "

## 2024-04-19 NOTE — Telephone Encounter (Signed)
 This is from her oncology appt 04/17/24: About one week after starting radiation she had a syncopal episode with a fall and concussion requiring sutures and hospitalization. Since then she has severe headaches, persistent unilateral head pain, blurred vision, and memory concerns that feel different from her prior migraines. She has a brain MRI scheduled later this month. She uses acetaminophen  with minimal relief, has no benefit from caffeine, and has not recently used her prescription migraine medication. She is interested in over-the-counter migraine supplements such as MIG Relief.

## 2024-04-19 NOTE — Telephone Encounter (Signed)
 Reviewed chart and spoke with patient. The night of 03/21/24 pt took Trazodone  for the first time and in the middle of the night she fainted. She had a concussion, laceration to eye required stitches, was seen at ER. Patient reports horrible headaches since then. Migraines meds not effective. Headaches make her tired. Keeps a 2-3 severity headache constant. Pain located on L side (jaw, cheek, behind eye). She continues to take 1/2 ambien nightly with no s/e. Patient reports oncology told her to call neurology. Pcp took stitches out but did not further evaluate concussion. Pt has MRI that Dr Chalice ordered for memory, pending for 1/17. Patient wants it evaluated for concussion and says she is going to have to r/s the appt.

## 2024-04-20 NOTE — Telephone Encounter (Signed)
 Hi Bethany <  Yes, I am happy to work her in.   The MRI will rule out bleed, edema, etc- a concussion is not visible on MRI. We need to get orthostatic BP and HR when she comes back.   Trazodone  can worsen bradycardia in some patients, usually elderly, It is possible that trazodone  was not the cause of  her passing -out , but I wouldn't risk it and have d/c this medication.  Ambien is fine by me,  PCP is prescribing it.  As for the headaches - these are now posttraumatic headaches and she can use OTC  medications and ice packs.  Should these not work to reduce the headache, I will start her on a non-vasoconstrictive medication ( not triptan ) such as as Nurtec or Quilipta po.   Dedra Gores, MD

## 2024-04-23 NOTE — Telephone Encounter (Signed)
 Called pt and LVM offering appt this AM with Dr Dohmeier 10:30 arrive 10:00. Left office number in message.

## 2024-04-23 NOTE — Telephone Encounter (Signed)
 Spoke with zanobia in phone room. Pt called back and told her she was out of town through 1/22, asked for appt week of 1/26. I offered appt 05/08/24 at 11:30 AM check-in at 11:15. The patient accepted and was scheduled. Also asked Zanobia to relay  message from Dr Chalice stating pt is ok to take OTC medications and apply ice packs for post-traumatic headache but follow bottle instructions.

## 2024-04-28 ENCOUNTER — Other Ambulatory Visit

## 2024-05-02 ENCOUNTER — Telehealth: Payer: Self-pay | Admitting: Neurology

## 2024-05-02 NOTE — Telephone Encounter (Signed)
 Spoke with Sandra Juarez. Got her scheduled with Dr Dohmeier tomorrow 1/22 at 230 pm check-in at 215 pm.

## 2024-05-02 NOTE — Telephone Encounter (Signed)
 MYC cxl

## 2024-05-02 NOTE — Telephone Encounter (Signed)
 Pt is asking if there is a way that RN can get her in for Dr Dohmeier's next available in June.  Pt states the appointment that was scheduled for 01-27 has been cx , please call  with any available appointment in near by future as pt has many concerns to discuss with MD

## 2024-05-03 ENCOUNTER — Ambulatory Visit: Admitting: Neurology

## 2024-05-03 ENCOUNTER — Encounter: Payer: Self-pay | Admitting: Neurology

## 2024-05-03 VITALS — Ht 67.0 in | Wt 125.0 lb

## 2024-05-03 DIAGNOSIS — F0781 Postconcussional syndrome: Secondary | ICD-10-CM | POA: Diagnosis not present

## 2024-05-03 DIAGNOSIS — R55 Syncope and collapse: Secondary | ICD-10-CM

## 2024-05-03 DIAGNOSIS — T887XXA Unspecified adverse effect of drug or medicament, initial encounter: Secondary | ICD-10-CM | POA: Insufficient documentation

## 2024-05-03 NOTE — Progress Notes (Signed)
 "        Provider:  Dedra Gores, MD  Primary Care Physician:  Stephane [name], MD [address] [place] [address]     Referring Provider: Stephane [name], Md [address] [place],  [place] [address]          Chief Complaint according to patient   Patient presents with:                HISTORY OF PRESENT ILLNESS:  Sandra Juarez is a 54 y.o. female patient who is here for revisit 05/03/2024 for  a new problem:   Former ly dr Ines and ow followed by me.   Chief concern according to patient :  my concussion.  Lost consciousness  for few minutes after fainting and falling , injured her head was very nauseated , vomited. 03-22-2024.   Since her last visit she has received a dx of breast cancer , her insomnia was  reason I gave her a sleep aid, I wrote for trazodone  and she took only one tab and only once: in December became ill from it, fainted and hit her head, large wound to the left temple, needed stitches, has headaches since and is not feeling better-  postconcussion .   Finished  breast cancer radiation on Dec 24th.   She is light and sound sensitive, difficulties to focus, tired, and sleeps only on Ambien . Goes to bed at 8-9 and gets up at 4 AM.  She stopped all ETOH, and sleeps better.  She does not take any supplements.   We discussed the negative ATN and ANA test. MRI pending.  She had a ct facial bone and cervical spine .     Oneita Juarez is a 54 y.o. female and seen here on 02/28/2024 upon referral from Dr. Cleotilde for a Consultation/ Evaluation of memory changes. He is mainly worried since both her parents struggle with dementia, father at 9 and mother at 13, paternal aunt started showing signs in her years 25-76 , same for maternal aunt.   Father  no longer can read, mom can't pay bills any longer.   She struggles with the stress, and she is feeling overwhelmed.  Sandra Juarez has a 75 and a 72 year old  child, has just completed intraductal breast  cancer surgery, and awaits to start with  radiation later this months.    This patient reports onset of subjective memory  over a period of the last 18 months, feeling more often overwhelmed, needing memory crutches of  lists, memos, and calendar entry. Routines and order is very important now.  She has recently sent an invite with the wrong RSVP number.      Fam Hx: see above'   Social history,  she is married  with children,  masters degree .   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 Home schooled her child, and has been involved .   Non smoker, 2 glasses a night -  ( social drinker- now since dx of breast cancer -none).   Walking for exercise.      Routines : 7 AM wake up, rises at 7. 30,  lets the dog out, and plans for the day one day ahead. Naps - 30 minutes and bed time 9.30- bedroom is cool, dark, and quiet, wakes 2.30 and 3.30 AM- every night.  One time nocturia each night .   Takes Ambien as a sleep aid, 19 years.  Review of Systems: Out of a complete 14 system review, the patient complains of only the following symptoms, and all other reviewed systems are negative.:   Headaches in the left temple,  cannot sleep on the left face.    MRI pending.    Social History   Socioeconomic History   Marital status: Married    Spouse name: Sandra Juarez   Number of children: 2   Years of education: Masters   Highest education level: Not on file  Occupational History   Not on file  Tobacco Use   Smoking status: Never    Passive exposure: Never   Smokeless tobacco: Never  Vaping Use   Vaping status: Never Used  Substance and Sexual Activity   Alcohol use: Yes    Alcohol/week: 14.0 standard drinks of alcohol    Types: 14 Glasses of wine per week   Drug use: No   Sexual activity: Yes    Partners: Male  Other Topics Concern   Not on file  Social History Narrative   Lives at home with husband and 2 children   Caffeine use: occasionally, 1-2 cups of  coffee daily    Social Drivers of Health   Tobacco Use: Low Risk (05/03/2024)   Patient History    Smoking Tobacco Use: Never    Smokeless Tobacco Use: Never    Passive Exposure: Never  Financial Resource Strain: Not on file  Food Insecurity: No Food Insecurity (03/13/2024)   Epic    Worried About Programme Researcher, Broadcasting/film/video in the Last Year: Never true    Ran Out of Food in the Last Year: Never true  Transportation Needs: No Transportation Needs (03/13/2024)   Epic    Lack of Transportation (Medical): No    Lack of Transportation (Non-Medical): No  Physical Activity: Not on file  Stress: Not on file  Social Connections: Not on file  Depression (PHQ2-9): Low Risk (03/13/2024)   Depression (PHQ2-9)    PHQ-2 Score: 0  Alcohol Screen: Not on file  Housing: Unknown (03/13/2024)   Received from [facility]   Epic    Unable to Pay for Housing in the Last Year: Not on file    Number of Times Moved in the Last Year: Not on file    At any time in the past 12 months, were you homeless or living in a shelter (including now)?: No  Utilities: Not At Risk (03/13/2024)   Epic    Threatened with loss of utilities: No  Health Literacy: Not on file    Family History  Problem Relation Age of Onset   Migraines Mother    Memory loss Mother    Multiple sclerosis Mother    Memory loss Father    Hypertension Brother    Diabetes Brother        AODM   Cancer Paternal Grandfather 86       dec   Memory loss Maternal Aunt    Memory loss Paternal Aunt    Breast cancer Neg Hx    Stroke Neg Hx    Seizures Neg Hx    Sleep apnea Neg Hx     Past Medical History:  Diagnosis Date   Abnormal Pap smear of cervix 2010   Anaphylactic reaction    x 2   (hospitalized)   Anxiety    Cervical disc disorder    Depression    Encounter for insertion of Mirena  IUD 03/2011, 03/2016   Laceration of right little  finger 10/2016   Migraines    STD (sexually transmitted disease)    HPV    Past  Surgical History:  Procedure Laterality Date   BREAST BIOPSY Right 01/23/2024   MM RT BREAST BX W LOC DEV 1ST LESION IMAGE BX SPEC STEREO GUIDE 01/23/2024 [facility]   BREAST BIOPSY  02/10/2024   MM RT RADIOACTIVE SEED LOC MAMMO GUIDE 02/10/2024 [facility]   BREAST BIOPSY  02/10/2024   MM RT RADIOACTIVE SEED EA ADD LESION LOC MAMMO GUIDE 02/10/2024 [facility]   BREAST LUMPECTOMY WITH RADIOACTIVE SEED LOCALIZATION Right 02/13/2024   Procedure: BREAST LUMPECTOMY WITH RADIOACTIVE SEED LOCALIZATION;  Surgeon: Vernetta Berg, MD;  Location: [facility];  Service: General;  Laterality: Right;   CESAREAN SECTION  2004 ,2006   x 2   COLPOSCOPY  02/2009   Low-grade squamous intraepithelial lesion (LGSIL) on Bx   FINGER SURGERY Right 10/2016   Little finger     Medications Ordered Prior to Encounter[1]  Allergies[2]   DIAGNOSTIC DATA (LABS, IMAGING, TESTING) - I reviewed patient records, labs, notes, testing and imaging myself where available.  Lab Results  Component Value Date   WBC 7.0 03/22/2024   HGB 11.6 (L) 03/22/2024   HCT 34.0 (L) 03/22/2024   MCV 93.3 03/22/2024   PLT 201 03/22/2024      Component Value Date/Time   NA 141 03/22/2024 0706   NA 140 02/28/2024 1117   K 3.2 (L) 03/22/2024 0706   CL 102 03/22/2024 0706   CO2 26 03/22/2024 0615   GLUCOSE 91 03/22/2024 0706   BUN 14 03/22/2024 0706   BUN 17 02/28/2024 1117   CREATININE 0.70 03/22/2024 0706   CREATININE 0.87 02/01/2024 1224   CALCIUM 8.2 (L) 03/22/2024 0615   PROT 6.1 (L) 03/22/2024 0615   PROT 6.9 02/28/2024 1117   ALBUMIN 3.5 03/22/2024 0615   ALBUMIN 4.5 02/28/2024 1117   AST 24 03/22/2024 0615   AST 15 02/01/2024 1224   ALT 16 03/22/2024 0615   ALT 10 02/01/2024 1224   ALKPHOS 28 (L) 03/22/2024 0615   BILITOT 1.0 03/22/2024 0615   BILITOT 0.3 02/28/2024 1117   BILITOT 0.6 02/01/2024 1224   GFRNONAA >60 03/22/2024 0615   GFRNONAA >60 02/01/2024 1224   No  results found for: CHOL, HDL, LDLCALC, LDLDIRECT, TRIG, CHOLHDL Lab Results  Component Value Date   HGBA1C 5.4 02/28/2024   Lab Results  Component Value Date   VITAMINB12 561 02/28/2024   Lab Results  Component Value Date   TSH 1.530 02/28/2024    PHYSICAL EXAM:  There were no vitals filed for this visit. Orthostatic VS for the past 24 hrs (Last 3 readings):  BP- Lying Pulse- Lying BP- Sitting Pulse- Sitting BP- Standing at 0 minutes Pulse- Standing at 0 minutes  05/03/24 1456 118/69 59 121/72 63 125/82 73   Body mass index is 19.58 kg/m.   Wt Readings from Last 3 Encounters:  05/03/24 125 lb (56.7 kg)  04/17/24 128 lb 9.6 oz (58.3 kg)  03/13/24 126 lb 6 oz (57.3 kg)     Ht Readings from Last 3 Encounters:  05/03/24 5' 7 (1.702 m)  04/17/24 5' 7 (1.702 m)  03/13/24 5' 7 (1.702 m)      General: The patient is awake, alert and appears not in acute distress and groomed. Head: swelling over the left temple.  Neck is supple.   NEUROLOGIC EXAM: The patient is awake and alert, oriented to place and  time.   Memory subjective described as intact.  Attention span & concentration ability appears normal.   Speech is fluent,  without  dysarthria, dysphonia or aphasia.  Mood and affect are appropriate.   Neurological Examination: Mental Status: Intact. Language and speech are normal. No cognitive deficits. Cranial Nerves II-XII: Intact. PERL. EOMI. VFF. No nystagmus.  Facial swelling.  No ptosis.  Hearing is grossly intact bilaterally.  The tongue is normal and midline.  Motor: Strengths are 5/5 throughout. Muscle bulk and tone are normal. No tremors.  Coordination: No ataxia or dysmetria.  Sensory: Grossly intact throughout to all modalities. Reflexes: Normal and symmetric throughout.   ASSESSMENT AND PLAN :   54 y.o. year old female  here with:    1) post concussion , faint and fall after trazodone  dose.   2) remaining postraumatic headaches.   Responding partially to to Excedrin.   3) facial pain from injury.     PLAN :  Normal orthostatic BP here   MRI brain with and without,  now for concussion injuries.  Last MRI 04-2021 with dr Ines.   Cardiac monitor ordered, fainting on trazodone  is not expected.   Nurtec samples.  6 tabs in 3 packs, samples . LOT number : [id number] with  exp 28-11, [place].  Call me if it works   RV as needed.    I would like to thank Stephane [name], MD  for allowing me to meet with this pleasant patient.   Sleep Clinic Patients are generally offered input on sleep hygiene, life style changes and how to improve compliance with medical treatment where applicable. Review and reiteration of good sleep hygiene measures is offered to any sleep clinic patient, be it in the first consultation or with any follow up visits.    Any patient with sleepiness should be cautioned not to drive, work at heights, or operate dangerous or heavy equipment when feeling tired or sleepy.      The patient will be seen in follow-up in the sleep clinic at [facility] for discussion of test results, sleep related symptoms and treatment compliance review, further management strategies, etc.   The referring provider will be notified of the test results.   The patient's condition requires frequent monitoring and adjustments in the treatment plan, reflecting the ongoing complexity of care.  This provider is the continuing focal point for all needed services for this condition.  After spending a total time of  39  minutes face to face and time for  history taking, physical and neurologic examination, review of laboratory studies,  personal review of imaging studies, reports and results of other testing and review of referral information / records as far as provided in visit,   Electronically signed by: Dedra Gores, MD 05/03/2024 3:25 PM  [facility] Sleep Board certified by [employer] of Sleep  Medicine and Diplomate of the [employer] of Sleep Medicine. Board certified In Neurology through the ABPN, Fellow of the [employer] of Neurology.       [1]  Current Outpatient Medications on File Prior to Visit  Medication Sig Dispense Refill   citalopram (CELEXA) 20 MG tablet Take 20 mg by mouth daily.  3   EPINEPHrine  0.3 mg/0.3 mL IJ SOAJ injection Inject 0.3 mLs (0.3 mg total) into the muscle as needed for anaphylaxis. 1 each 1   rizatriptan (MAXALT-MLT) 10 MG disintegrating tablet Take 1 tablet by mouth daily as needed.     traMADol  (  ULTRAM ) 50 MG tablet Take 1 tablet (50 mg total) by mouth every 6 (six) hours as needed for moderate pain (pain score 4-6) or severe pain (pain score 7-10). 20 tablet 0   triamcinolone  cream (KENALOG ) 0.1 % Apply thin layer to skin in treatment area twice daily during radiation and for 2 weeks after. 454 g 0   triamcinolone  ointment (KENALOG ) 0.5 % Apply to affected area twice daily. 30 g 0   zolpidem (AMBIEN) 10 MG tablet Take 5-10 mg by mouth at bedtime as needed.     No current facility-administered medications on file prior to visit.  [2]  Allergies Allergen Reactions   Lidocaine Other (See Comments)    Pass out after  A train comes on and I get real hot then will pass out after.   2 separate times  lidocaine   "

## 2024-05-03 NOTE — Patient Instructions (Signed)
" °  54 y.o. year old female  here with:    1) post concussion , faint and fall after trazodone  dose.   2) remaining postraumatic headaches.  Responding partially to to Excedrin.   3) facial pain from injury.     PLAN :  MRI brain with and without,  now for concussion injuries.  Last MRI 04-2021 with dr Ines.   Cardiac monitor ordered, fainting on trazodone  is not expected.   Nurtec samples.  6 tabs in 3 packs, samples . LOT number : [id number] with  exp 28-11, [place].  Call me if it works   RV as needed.    I would like to thank Stephane [name], MD  for allowing me to meet with this pleasant patient.  "

## 2024-05-07 ENCOUNTER — Ambulatory Visit: Admitting: Radiation Oncology

## 2024-05-07 NOTE — Progress Notes (Signed)
"  °  Radiation Oncology         [phone] ________________________________  Name: Sandra Juarez MRN: [id number]  Date: 05/10/2024  DOB: 25-Jun-1970  Follow-Up Visit Note   CC: Stephane [name], MD  Stephane [name], MD  No diagnosis found.  Diagnosis:   Ductal carcinoma in-situ of the R breast, D05.11   Interval Since Last Radiation:  one month   Narrative:  The patient returns today for routine follow-up.  Since completion of radiation she had follow up with Dr. Odean in medical oncology.  They discussed adjuvant low-dose tamoxifen  x5 years with plan to begin on 06/10/24 in setting of her fall, concussion and subsequent headaches. She has also had fu w/ her neurologist and discussed her concussion and posttraumatic headaches. Plan for MRI Brain WWO, cardiac monitor and referral to cardiology. Nurtec samples provided for headaches. She also saw dermatology for the R breast dermatitis which she developed after only one radiation treatment and which was not characteristic of radiation dermatitis. They recommended continued application of triamcinolone  and also prescribed clindamycin lotion. Plan for Amiactin in future and biopsy to area if it does not resolve.   Reports radiation induced fatigue has ***. Skin has ***  ALLERGIES:  is allergic to lidocaine.  Meds: Current Outpatient Medications  Medication Sig Dispense Refill   citalopram (CELEXA) 20 MG tablet Take 20 mg by mouth daily.  3   EPINEPHrine  0.3 mg/0.3 mL IJ SOAJ injection Inject 0.3 mLs (0.3 mg total) into the muscle as needed for anaphylaxis. 1 each 1   rizatriptan (MAXALT-MLT) 10 MG disintegrating tablet Take 1 tablet by mouth daily as needed.     traMADol  (ULTRAM ) 50 MG tablet Take 1 tablet (50 mg total) by mouth every 6 (six) hours as needed for moderate pain (pain score 4-6) or severe pain (pain score 7-10). 20 tablet 0   triamcinolone  cream (KENALOG ) 0.1 % Apply thin layer to skin in treatment area twice daily during  radiation and for 2 weeks after. 454 g 0   triamcinolone  ointment (KENALOG ) 0.5 % Apply to affected area twice daily. 30 g 0   zolpidem (AMBIEN) 10 MG tablet Take 5-10 mg by mouth at bedtime as needed.     No current facility-administered medications for this visit.    Physical Findings: The patient is in no acute distress. Patient is alert and oriented.  vitals were not taken for this visit. .  No significant changes.  Lab Findings: Lab Results  Component Value Date   WBC 7.0 03/22/2024   HGB 11.6 (L) 03/22/2024   HCT 34.0 (L) 03/22/2024   MCV 93.3 03/22/2024   PLT 201 03/22/2024    Radiographic Findings: No results found.  Impression:  The patient is recovering from the effects of radiation.  ***  Plan:  Fu as scheduled with Dr. Gudena. May fu in our clinic PRN.  Total time spent today in preparation for this visit was *** minutes. This included patient care, imaging and path review, documentation, multidisciplinary discussion and coordination of care and follow up.    Estefana HERO. Maritza, M.D.    "

## 2024-05-08 ENCOUNTER — Ambulatory Visit: Admitting: Neurology

## 2024-05-09 NOTE — Progress Notes (Signed)
 Sandra Juarez is here today for follow up post radiation to the right breast. She completed radiation treatment on 04-04-2024.      Does the patient complain of any of the following: Post radiation skin issues: Rash has cleared up. Breast Tenderness: Yes Breast Swelling: Denies Lymphadema: Denies Range of Motion limitations: Denies Fatigue post radiation: Moderate fatigue Appetite good/fair/poor: Good  Additional comments if applicable:Reports healing well.  BP 104/80 (BP Location: Left Arm, Patient Position: Sitting, Cuff Size: Large)   Pulse (!) 57   Temp (!) 97.4 F (36.3 C)   Resp 18   Ht 5' 7 (1.702 m)   SpO2 100%   BMI 19.58 kg/m

## 2024-05-10 ENCOUNTER — Encounter: Payer: Self-pay | Admitting: Radiation Oncology

## 2024-05-10 ENCOUNTER — Ambulatory Visit
Admission: RE | Admit: 2024-05-10 | Discharge: 2024-05-10 | Disposition: A | Discharge status: Home / Self Care | Source: Ambulatory Visit | Attending: Radiation Oncology | Admitting: Radiation Oncology

## 2024-05-10 VITALS — BP 104/80 | HR 57 | Temp 97.4°F | Resp 18 | Ht 67.0 in

## 2024-05-10 DIAGNOSIS — D0511 Intraductal carcinoma in situ of right breast: Secondary | ICD-10-CM

## 2024-06-04 ENCOUNTER — Other Ambulatory Visit

## 2024-06-27 ENCOUNTER — Ambulatory Visit: Admitting: Neurology

## 2024-07-18 ENCOUNTER — Inpatient Hospital Stay: Admitting: Adult Health

## 2024-07-18 ENCOUNTER — Inpatient Hospital Stay

## 2024-07-27 ENCOUNTER — Inpatient Hospital Stay: Admitting: Adult Health

## 2024-07-27 ENCOUNTER — Inpatient Hospital Stay: Payer: Self-pay

## 2024-10-02 ENCOUNTER — Ambulatory Visit (HOSPITAL_BASED_OUTPATIENT_CLINIC_OR_DEPARTMENT_OTHER): Admitting: Obstetrics & Gynecology
